# Patient Record
Sex: Female | Born: 1970 | Race: White | Hispanic: No | Marital: Single | Smoking: Never smoker
Health system: Southern US, Community
[De-identification: ages and names within clinical notes are randomized; demographics above are authoritative.]

## PROBLEM LIST (undated history)

## (undated) DIAGNOSIS — F329 Major depressive disorder, single episode, unspecified: Secondary | ICD-10-CM

## (undated) DIAGNOSIS — F32A Depression, unspecified: Secondary | ICD-10-CM

## (undated) DIAGNOSIS — R51 Headache: Secondary | ICD-10-CM

## (undated) DIAGNOSIS — R519 Headache, unspecified: Secondary | ICD-10-CM

## (undated) HISTORY — DX: Depression, unspecified: F32.A

## (undated) HISTORY — DX: Headache, unspecified: R51.9

## (undated) HISTORY — DX: Headache: R51

## (undated) HISTORY — DX: Major depressive disorder, single episode, unspecified: F32.9

## (undated) HISTORY — PX: ABDOMINAL HYSTERECTOMY: SHX81

---

## 2005-12-31 ENCOUNTER — Ambulatory Visit: Payer: Self-pay | Admitting: Unknown Physician Specialty

## 2009-11-01 ENCOUNTER — Emergency Department: Payer: Self-pay | Admitting: Emergency Medicine

## 2012-06-22 HISTORY — PX: AUGMENTATION MAMMAPLASTY: SUR837

## 2012-12-07 ENCOUNTER — Other Ambulatory Visit: Payer: Self-pay | Admitting: Family Medicine

## 2012-12-07 LAB — CBC WITH DIFFERENTIAL/PLATELET
Eosinophil #: 0.1 10*3/uL (ref 0.0–0.7)
Eosinophil %: 1.7 %
HCT: 40.4 % (ref 35.0–47.0)
Lymphocyte %: 31.2 %
MCH: 31.3 pg (ref 26.0–34.0)
MCHC: 34 g/dL (ref 32.0–36.0)
MCV: 92 fL (ref 80–100)
Monocyte %: 6.3 %
Neutrophil %: 60.3 %
Platelet: 224 10*3/uL (ref 150–440)
RBC: 4.4 10*6/uL (ref 3.80–5.20)
RDW: 13.1 % (ref 11.5–14.5)
WBC: 6.2 10*3/uL (ref 3.6–11.0)

## 2012-12-07 LAB — COMPREHENSIVE METABOLIC PANEL
Albumin: 4.2 g/dL (ref 3.4–5.0)
Alkaline Phosphatase: 75 U/L (ref 50–136)
Bilirubin,Total: 0.3 mg/dL (ref 0.2–1.0)
Calcium, Total: 8.7 mg/dL (ref 8.5–10.1)
Chloride: 104 mmol/L (ref 98–107)
Co2: 29 mmol/L (ref 21–32)
Creatinine: 0.72 mg/dL (ref 0.60–1.30)
Glucose: 81 mg/dL (ref 65–99)
Osmolality: 277 (ref 275–301)
Potassium: 3.8 mmol/L (ref 3.5–5.1)
Sodium: 138 mmol/L (ref 136–145)
Total Protein: 7.6 g/dL (ref 6.4–8.2)

## 2012-12-07 LAB — LIPID PANEL
Cholesterol: 191 mg/dL (ref 0–200)
Ldl Cholesterol, Calc: 93 mg/dL (ref 0–100)
Triglycerides: 68 mg/dL (ref 0–200)
VLDL Cholesterol, Calc: 14 mg/dL (ref 5–40)

## 2012-12-12 ENCOUNTER — Ambulatory Visit: Payer: Self-pay | Admitting: Family Medicine

## 2016-07-02 ENCOUNTER — Ambulatory Visit (INDEPENDENT_AMBULATORY_CARE_PROVIDER_SITE_OTHER): Payer: BLUE CROSS/BLUE SHIELD | Admitting: Family Medicine

## 2016-07-02 ENCOUNTER — Ambulatory Visit (INDEPENDENT_AMBULATORY_CARE_PROVIDER_SITE_OTHER): Payer: BLUE CROSS/BLUE SHIELD

## 2016-07-02 ENCOUNTER — Encounter: Payer: Self-pay | Admitting: Family Medicine

## 2016-07-02 VITALS — BP 119/80 | HR 68 | Temp 99.1°F | Resp 14 | Wt 133.8 lb

## 2016-07-02 DIAGNOSIS — F329 Major depressive disorder, single episode, unspecified: Secondary | ICD-10-CM | POA: Insufficient documentation

## 2016-07-02 DIAGNOSIS — F419 Anxiety disorder, unspecified: Secondary | ICD-10-CM

## 2016-07-02 DIAGNOSIS — R69 Illness, unspecified: Secondary | ICD-10-CM

## 2016-07-02 DIAGNOSIS — G4700 Insomnia, unspecified: Secondary | ICD-10-CM | POA: Insufficient documentation

## 2016-07-02 DIAGNOSIS — J111 Influenza due to unidentified influenza virus with other respiratory manifestations: Secondary | ICD-10-CM

## 2016-07-02 LAB — POCT INFLUENZA A/B
INFLUENZA B, POC: NEGATIVE
Influenza A, POC: NEGATIVE

## 2016-07-02 MED ORDER — HYDROCOD POLST-CPM POLST ER 10-8 MG/5ML PO SUER: 5.0000 mL | Freq: Two times a day (BID) | ORAL | 0 refills | Status: DC | PRN

## 2016-07-02 NOTE — Patient Instructions (Signed)
Cough medicine as needed.  Rest. Fluids.  We will call with xray results.  Take care  Dr. Adriana Simasook  Follow up annually or sooner if needed

## 2016-07-02 NOTE — Progress Notes (Signed)
Pre visit review using our clinic review tool, if applicable. No additional management support is needed unless otherwise documented below in the visit note. 

## 2016-07-02 NOTE — Progress Notes (Signed)
Subjective:  Patient ID: Allison Hudson, female    DOB: 1971/01/17  Age: 46 y.o. MRN: [id number]  CC: Acute illness - concern for flu  HPI Allison Hudson is a 46 y.o. female presents to the clinic today with concerns for influenza.  Patient states she's been sick since Monday. She's had cough, fever, headache, sore throat, body aches, chills. Most recent fever was last night and was 103. She's been using Advil cold as well as ibuprofen. She's also been using Mucinex. Little improvement in symptoms. Symptoms are severe. No known exacerbating factors. She has had numerous sick contacts at work. No other associated symptoms. No other complaints or concerns at this time.  PMH, Surgical Hx, Family Hx, Social History reviewed and updated as below.  Past Medical History:  Diagnosis Date  . Depression   . Frequent headaches    Past Surgical History:  Procedure Laterality Date  . ABDOMINAL HYSTERECTOMY     Family hx - None per report.  Social History  Substance Use Topics  . Smoking status: Never Smoker  . Smokeless tobacco: Never Used  . Alcohol use 1.8 oz/week    3 Standard drinks or equivalent per week   Review of Systems  Constitutional: Positive for chills and fever.  HENT: Positive for sore throat.   Respiratory: Positive for cough.   Gastrointestinal:       Abdominal cramping.  Neurological: Positive for headaches.  Psychiatric/Behavioral: Positive for sleep disturbance.  All other systems reviewed and are negative.  Objective:   Today's Vitals: BP 119/80   Pulse 68   Temp 99.1 F (37.3 C) (Oral)   Resp 14   Wt 133 lb 12.8 oz (60.7 kg)   SpO2 96%   Physical Exam  Constitutional: She is oriented to person, place, and time. She appears well-developed and well-nourished. No distress.  HENT:  Head: Normocephalic and atraumatic.  Nose: Nose normal.  Mouth/Throat: Oropharynx is clear and moist. No oropharyngeal exudate.  Normal TMs bilaterally.  Eyes: Conjunctivae  are normal. No scleral icterus.  Neck: Neck supple.  Cardiovascular: Normal rate and regular rhythm.   No murmur heard. Pulmonary/Chest: Effort normal and breath sounds normal. She has no wheezes. She has no rales.  Abdominal: Soft. She exhibits no distension.  Tender palpation in the epigastric region as well as the left lower quadrant.  Musculoskeletal: Normal range of motion. She exhibits no edema.  Lymphadenopathy:    She has no cervical adenopathy.  Neurological: She is alert and oriented to person, place, and time.  Skin: Skin is warm and dry. No rash noted.  Psychiatric: She has a normal mood and affect.  Vitals reviewed.  Assessment & Plan:   Problem List Items Addressed This Visit    Influenza-like illness - Primary    New problem. Flu negative. Out of the treatment window even if actually has influenza. Treating cough with Tussionex. Obtaining chest x-ray today given high fever and cough, to ensure no underlying pneumonia.      Relevant Medications   chlorpheniramine-HYDROcodone (TUSSIONEX PENNKINETIC ER) 10-8 MG/5ML SUER   Other Relevant Orders   POCT Influenza A/B (Completed)   DG Chest 2 View      Outpatient Encounter Prescriptions as of 07/02/2016  Medication Sig  . Cholecalciferol (VITAMIN D3) 1000 units CAPS Take by mouth.  . citalopram (CELEXA) 20 MG tablet Take by mouth.  . Multiple Vitamin (MULTI-VITAMINS) TABS Take by mouth.  . chlorpheniramine-HYDROcodone (TUSSIONEX PENNKINETIC ER) 10-8 MG/5ML SUER Take 5  mLs by mouth every 12 (twelve) hours as needed.   No facility-administered encounter medications on file as of 07/02/2016.     Follow-up: PRN  Everlene Other DO [facility]

## 2016-07-02 NOTE — Assessment & Plan Note (Signed)
New problem. Flu negative. Out of the treatment window even if actually has influenza. Treating cough with Tussionex. Obtaining chest x-ray today given high fever and cough, to ensure no underlying pneumonia.

## 2017-01-06 ENCOUNTER — Other Ambulatory Visit: Payer: Self-pay | Admitting: *Deleted

## 2017-01-06 NOTE — Telephone Encounter (Signed)
Medication Refill requested for :Celexa  Pharmacy:[facility] [address]  Return Contact : [phone]

## 2017-01-06 NOTE — Telephone Encounter (Signed)
Last ov 07/02/16 for influenza like illness (first appmt with [facility], pt has not been seen since) Filed under historical Patient requested and received refill form Dr.Bronstein PCP 12/08/16 and was not given refills. Was scheduled for follow up and notified to keep appmt for further refills with [facility].Patient did not keep appmt which was made 12/21/16.

## 2017-01-07 NOTE — Telephone Encounter (Signed)
Left message to return call to inform patient she needs to establish care here or see her pcp Dr.Bronstein for refills

## 2017-01-12 ENCOUNTER — Other Ambulatory Visit: Payer: Self-pay | Admitting: Family Medicine

## 2017-01-12 MED ORDER — CITALOPRAM HYDROBROMIDE 20 MG PO TABS: 20.0000 mg | ORAL_TABLET | Freq: Every day | ORAL | 1 refills | Status: DC

## 2017-02-15 ENCOUNTER — Ambulatory Visit: Payer: BLUE CROSS/BLUE SHIELD | Admitting: Family Medicine

## 2017-03-01 ENCOUNTER — Encounter: Payer: Self-pay | Admitting: Family Medicine

## 2017-03-01 ENCOUNTER — Ambulatory Visit (INDEPENDENT_AMBULATORY_CARE_PROVIDER_SITE_OTHER): Payer: BLUE CROSS/BLUE SHIELD | Admitting: Family Medicine

## 2017-03-01 VITALS — BP 122/80 | HR 84 | Temp 97.6°F | Ht 62.0 in | Wt 136.8 lb

## 2017-03-01 DIAGNOSIS — Z79890 Hormone replacement therapy (postmenopausal): Secondary | ICD-10-CM | POA: Diagnosis not present

## 2017-03-01 DIAGNOSIS — F329 Major depressive disorder, single episode, unspecified: Secondary | ICD-10-CM

## 2017-03-01 DIAGNOSIS — F419 Anxiety disorder, unspecified: Secondary | ICD-10-CM | POA: Diagnosis not present

## 2017-03-01 MED ORDER — CITALOPRAM HYDROBROMIDE 20 MG PO TABS
20.0000 mg | ORAL_TABLET | Freq: Every day | ORAL | 1 refills | Status: DC
Start: 2017-03-01 — End: 2017-08-30

## 2017-03-01 NOTE — Progress Notes (Signed)
  Tommi Rumps, MD Phone: [phone]  Allison Hudson is a 46 y.o. female who presents today for follow-up.  Anxiety and depression: Patient notes it's anxiety now. No depression. She is on Celexa and this is quite helpful. When she doesn't take it she'll get more emotional. She has some mild anxiety based on her job. No SI or HI.  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 She cycles on and off. Started this about 9 months ago. She wants to know if any lab work needs to be checked. She does note deepening of her voice and increased hair growth while being on this.   ROS see history of present illness  Objective  Physical Exam Vitals:   03/01/17 1514  BP: 122/80  Pulse: 84  Temp: 97.6 F (36.4 C)  SpO2: 98%    BP Readings from Last 3 Encounters:  03/01/17 122/80  07/02/16 119/80   Wt Readings from Last 3 Encounters:  03/01/17 136 lb 12.8 oz (62.1 kg)  07/02/16 133 lb 12.8 oz (60.7 kg)    Physical Exam  Constitutional: No distress.  Cardiovascular: Normal rate, regular rhythm and normal heart sounds.   Pulmonary/Chest: Effort normal and breath sounds normal.  Musculoskeletal: She exhibits no edema.  Neurological: She is alert. Gait normal.  Skin: Skin is warm and dry. She is not diaphoretic.     Assessment/Plan: Please see individual problem list.  Anxiety and depression Well-controlled on Celexa. Refill given.  Long-term current use of testosterone replacement therapy Patient has been using testosterone supplementation for body building. I advised that she should not do this though she notes she is not going to stop. I discussed the cardiac risk of taking this medication. We will check lab work as outlined below.   Orders Placed This Encounter  Procedures  . Testosterone    Standing Status:   Future    Standing Expiration Date:   03/01/2018  . Lipid panel    Standing Status:   Future    Standing Expiration Date:    03/01/2018  . Comp Met (CMET)    Standing Status:   Future    Standing Expiration Date:   03/01/2018  . CBC    Standing Status:   Future    Standing Expiration Date:   03/01/2018    Meds ordered this encounter  Medications  . DISCONTD: citalopram (CELEXA) 20 MG tablet    Sig: Take 20 mg by mouth daily.  Marland Kitchen DISCONTD: Cholecalciferol (VITAMIN D3) 1000 units CAPS    Sig: Take 1,000 Units by mouth.  . DISCONTD: Multiple Vitamin (MULTI-VITAMINS) TABS    Sig: Take by mouth.  . Ascorbic Acid (VITAMIN C) 100 MG tablet    Sig: Take 100 mg by mouth daily.  . citalopram (CELEXA) 20 MG tablet    Sig: Take 1 tablet (20 mg total) by mouth daily.    Dispense:  90 tablet    Refill:  1   Tommi Rumps, MD [place]

## 2017-03-01 NOTE — Patient Instructions (Signed)
Nice to meet you. We will have you return for fasting lab work. This should be done between 8 and 10 AM. You can have black coffee and water after midnight when you do this.

## 2017-03-02 ENCOUNTER — Encounter: Payer: Self-pay | Admitting: Family Medicine

## 2017-03-02 ENCOUNTER — Telehealth: Payer: Self-pay | Admitting: Family Medicine

## 2017-03-02 DIAGNOSIS — Z79890 Hormone replacement therapy (postmenopausal): Secondary | ICD-10-CM

## 2017-03-02 DIAGNOSIS — Z5181 Encounter for therapeutic drug level monitoring: Secondary | ICD-10-CM | POA: Insufficient documentation

## 2017-03-02 NOTE — Assessment & Plan Note (Signed)
Patient has been using testosterone supplementation for body building. I advised that she should not do this though she notes she is not going to stop. I discussed the cardiac risk of taking this medication. We will check lab work as outlined below.

## 2017-03-02 NOTE — Assessment & Plan Note (Addendum)
Well-controlled on Celexa. Refill given.

## 2017-03-02 NOTE — Telephone Encounter (Signed)
Please advise 

## 2017-03-02 NOTE — Telephone Encounter (Signed)
Pt called and wished to speak with Dr. Birdie SonsSonnenberg about something personal. Please advise, thank you!  Call pt @ [phone]

## 2017-03-02 NOTE — Telephone Encounter (Signed)
Spoke with patient. She wanted to inform us that she was also taking growth hormone. She notes she will bring in a copy of her supplements with doses when she gets them completed.

## 2017-03-05 ENCOUNTER — Other Ambulatory Visit: Payer: BLUE CROSS/BLUE SHIELD

## 2017-08-30 ENCOUNTER — Other Ambulatory Visit (HOSPITAL_COMMUNITY)
Admission: RE | Admit: 2017-08-30 | Discharge: 2017-08-30 | Disposition: A | Discharge status: Home / Self Care | Payer: BLUE CROSS/BLUE SHIELD | Source: Ambulatory Visit | Attending: Family Medicine | Admitting: Family Medicine

## 2017-08-30 ENCOUNTER — Other Ambulatory Visit: Payer: Self-pay

## 2017-08-30 ENCOUNTER — Ambulatory Visit (INDEPENDENT_AMBULATORY_CARE_PROVIDER_SITE_OTHER): Payer: BLUE CROSS/BLUE SHIELD | Admitting: Family Medicine

## 2017-08-30 ENCOUNTER — Encounter: Payer: Self-pay | Admitting: Family Medicine

## 2017-08-30 VITALS — BP 100/70 | HR 63 | Temp 98.2°F | Ht 62.0 in | Wt 131.2 lb

## 2017-08-30 DIAGNOSIS — F329 Major depressive disorder, single episode, unspecified: Secondary | ICD-10-CM

## 2017-08-30 DIAGNOSIS — F419 Anxiety disorder, unspecified: Secondary | ICD-10-CM

## 2017-08-30 DIAGNOSIS — R102 Pelvic and perineal pain: Secondary | ICD-10-CM | POA: Diagnosis not present

## 2017-08-30 DIAGNOSIS — Z124 Encounter for screening for malignant neoplasm of cervix: Secondary | ICD-10-CM | POA: Diagnosis not present

## 2017-08-30 DIAGNOSIS — Z79890 Hormone replacement therapy (postmenopausal): Secondary | ICD-10-CM

## 2017-08-30 MED ORDER — CITALOPRAM HYDROBROMIDE 40 MG PO TABS: 40.0000 mg | ORAL_TABLET | Freq: Every day | ORAL | 1 refills | Status: DC

## 2017-08-30 NOTE — Patient Instructions (Signed)
Nice to see you. We will go up on your Celexa. We will get you to see gynecology. We will check some lab work today. If you have worsening discomfort please be evaluated immediately.

## 2017-08-30 NOTE — Assessment & Plan Note (Signed)
Worsened recently.  Discussed therapy and medication though she declined therapy.  We will increase her Celexa.  She will switch it to the morning to see if that helps with not staying awake at night.  She is given return precautions.

## 2017-08-30 NOTE — Progress Notes (Signed)
Marikay Alar, MD Phone: [phone]  Allison Hudson is a 47 y.o. female who presents today for follow-up.  Anxiety/depression: Notes this has gotten worse recently.  She broke up with her long-term partner and has had trouble sleeping and not wanting to do anything since then.  She notes no SI.  She has been on Celexa.  Patient reports chronic right-sided pelvic discomfort that has been going on since a tummy tuck she had 2 years ago.  Notes it is intermittent.  Does hurt with palpation at times.  Has not worsened.  She reports a history of hysterectomy though she thinks that may have left her cervix.  Does still have her appendix.  No vaginal discharge, dysuria, or diarrhea.  Occasional abdominal cramping.  She is no longer on testosterone supplementation for body building though does continue to take growth hormone.  Social History   Tobacco Use  Smoking Status Never Smoker  Smokeless Tobacco Never Used     ROS see history of present illness  Objective  Physical Exam Vitals:   08/30/17 1540  BP: 100/70  Pulse: 63  Temp: 98.2 F (36.8 C)  SpO2: 95%    BP Readings from Last 3 Encounters:  08/30/17 100/70  03/01/17 122/80  07/02/16 119/80   Wt Readings from Last 3 Encounters:  08/30/17 131 lb 3.2 oz (59.5 kg)  03/01/17 136 lb 12.8 oz (62.1 kg)  07/02/16 133 lb 12.8 oz (60.7 kg)    Physical Exam  Constitutional: No distress.  Cardiovascular: Normal rate, regular rhythm and normal heart sounds.  Pulmonary/Chest: Effort normal and breath sounds normal.  Abdominal: Soft. Bowel sounds are normal. She exhibits no distension.    Genitourinary:  Genitourinary Comments: Normal external labia, normal vaginal mucosa, no discharge noted, cervix noted with possible cystic lesions at the 9:00 and 12:00 locations, no cervical motion tenderness, no adnexal masses palpated  Musculoskeletal: She exhibits no edema.  Neurological: She is alert. Gait normal.  Skin: Skin is  warm and dry. She is not diaphoretic.     Assessment/Plan: Please see individual problem list.  Anxiety and depression Worsened recently.  Discussed therapy and medication though she declined therapy.  We will increase her Celexa.  She will switch it to the morning to see if that helps with not staying awake at night.  She is given return precautions.  Pelvic pain Patient with right pelvic/suprapubic discomfort versus right lower quadrant discomfort that has been chronic.  Could be musculoskeletal or ovarian pathology.  History does not fit with an appendiceal issue.  Pelvic exam completed with Pap smear done.  Given possible cysts on cervix as well as question of pelvic pain we will refer to gynecology.  I offered imaging with CT scan or ultrasound though she deferred until her Pap smear returned and she had been evaluated by gynecology.  We will check a CBC and CMP.  Given return precautions.  Long-term current use of testosterone replacement therapy No longer on testosterone.  Now taking growth hormone.     Orders Placed This Encounter  Procedures  . Ambulatory referral to Gynecology    Referral Priority:   Routine    Referral Type:   Consultation    Referral Reason:   Specialty Services Required    Requested Specialty:   Gynecology    Number of Visits Requested:   1    Meds ordered this encounter  Medications  . citalopram (CELEXA) 40 MG tablet    Sig: Take 1 tablet (40  mg total) by mouth daily.    Dispense:  90 tablet    Refill:  1     Marikay AlarEric Sonnenberg, MD [facility]

## 2017-08-30 NOTE — Assessment & Plan Note (Signed)
Patient with right pelvic/suprapubic discomfort versus right lower quadrant discomfort that has been chronic.  Could be musculoskeletal or ovarian pathology.  History does not fit with an appendiceal issue.  Pelvic exam completed with Pap smear done.  Given possible cysts on cervix as well as question of pelvic pain we will refer to gynecology.  I offered imaging with CT scan or ultrasound though she deferred until her Pap smear returned and she had been evaluated by gynecology.  We will check a CBC and CMP.  Given return precautions.

## 2017-08-30 NOTE — Assessment & Plan Note (Signed)
No longer on testosterone.  Now taking growth hormone.

## 2017-08-31 LAB — CBC
HCT: 42 % (ref 36.0–46.0)
HEMOGLOBIN: 14.2 g/dL (ref 12.0–15.0)
MCHC: 33.9 g/dL (ref 30.0–36.0)
MCV: 95.2 fl (ref 78.0–100.0)
PLATELETS: 198 10*3/uL (ref 150.0–400.0)
RBC: 4.41 Mil/uL (ref 3.87–5.11)
RDW: 13.3 % (ref 11.5–15.5)
WBC: 6.9 10*3/uL (ref 4.0–10.5)

## 2017-08-31 LAB — COMPREHENSIVE METABOLIC PANEL
ALBUMIN: 4.3 g/dL (ref 3.5–5.2)
ALK PHOS: 58 U/L (ref 39–117)
ALT: 24 U/L (ref 0–35)
AST: 24 U/L (ref 0–37)
BILIRUBIN TOTAL: 0.4 mg/dL (ref 0.2–1.2)
BUN: 28 mg/dL — ABNORMAL HIGH (ref 6–23)
CALCIUM: 9.3 mg/dL (ref 8.4–10.5)
CO2: 30 meq/L (ref 19–32)
CREATININE: 0.97 mg/dL (ref 0.40–1.20)
Chloride: 103 mEq/L (ref 96–112)
GFR: 65.74 mL/min (ref >60.00)
Glucose, Bld: 60 mg/dL — ABNORMAL LOW (ref 70–99)
Potassium: 4.3 mEq/L (ref 3.5–5.1)
Sodium: 139 mEq/L (ref 135–145)
TOTAL PROTEIN: 6.9 g/dL (ref 6.0–8.3)

## 2017-09-02 ENCOUNTER — Other Ambulatory Visit: Payer: Self-pay | Admitting: Family Medicine

## 2017-09-02 DIAGNOSIS — E162 Hypoglycemia, unspecified: Secondary | ICD-10-CM

## 2017-09-02 LAB — CYTOLOGY - PAP
ADEQUACY: ABSENT
Diagnosis: NEGATIVE
HPV: NOT DETECTED

## 2017-09-03 ENCOUNTER — Other Ambulatory Visit (INDEPENDENT_AMBULATORY_CARE_PROVIDER_SITE_OTHER): Payer: BLUE CROSS/BLUE SHIELD

## 2017-09-03 DIAGNOSIS — E162 Hypoglycemia, unspecified: Secondary | ICD-10-CM | POA: Diagnosis not present

## 2017-09-03 NOTE — Addendum Note (Signed)
Addended by: Penne LashWIGGINS, [place] Anagnos N on: 09/03/2017 10:28 AM   Modules accepted: Orders

## 2017-09-04 LAB — BASIC METABOLIC PANEL
BUN/Creatinine Ratio: 26 (calc) — ABNORMAL HIGH (ref 6–22)
BUN: 31 mg/dL — ABNORMAL HIGH (ref 7–25)
CHLORIDE: 112 mmol/L — AB (ref 98–110)
CO2: 12 mmol/L — ABNORMAL LOW (ref 20–32)
Calcium: 9.5 mg/dL (ref 8.6–10.2)
Creat: 1.17 mg/dL — ABNORMAL HIGH (ref 0.50–1.10)
Glucose, Bld: 70 mg/dL (ref 65–99)
POTASSIUM: 6.9 mmol/L — AB (ref 3.5–5.3)
Sodium: 144 mmol/L (ref 135–146)

## 2017-09-05 ENCOUNTER — Other Ambulatory Visit
Admission: EM | Admit: 2017-09-05 | Discharge: 2017-09-05 | Disposition: A | Discharge status: Home / Self Care | Payer: BLUE CROSS/BLUE SHIELD | Source: Other Acute Inpatient Hospital | Attending: Family Medicine | Admitting: Family Medicine

## 2017-09-05 ENCOUNTER — Telehealth: Payer: Self-pay | Admitting: Family Medicine

## 2017-09-05 ENCOUNTER — Ambulatory Visit
Admission: EM | Admit: 2017-09-05 | Discharge: 2017-09-05 | Disposition: A | Discharge status: Home / Self Care | Payer: BLUE CROSS/BLUE SHIELD | Attending: Family Medicine | Admitting: Family Medicine

## 2017-09-05 ENCOUNTER — Other Ambulatory Visit: Payer: Self-pay

## 2017-09-05 ENCOUNTER — Encounter: Payer: Self-pay | Admitting: Emergency Medicine

## 2017-09-05 DIAGNOSIS — R634 Abnormal weight loss: Secondary | ICD-10-CM

## 2017-09-05 DIAGNOSIS — R899 Unspecified abnormal finding in specimens from other organs, systems and tissues: Secondary | ICD-10-CM | POA: Diagnosis not present

## 2017-09-05 DIAGNOSIS — R1030 Lower abdominal pain, unspecified: Secondary | ICD-10-CM

## 2017-09-05 DIAGNOSIS — G8929 Other chronic pain: Secondary | ICD-10-CM

## 2017-09-05 LAB — BASIC METABOLIC PANEL
ANION GAP: 6 (ref 5–15)
BUN: 24 mg/dL — ABNORMAL HIGH (ref 6–20)
CALCIUM: 8.7 mg/dL — AB (ref 8.9–10.3)
CO2: 23 mmol/L (ref 22–32)
Chloride: 105 mmol/L (ref 101–111)
Creatinine, Ser: 0.67 mg/dL (ref 0.44–1.00)
GLUCOSE: 171 mg/dL — AB (ref 65–99)
Potassium: 3.6 mmol/L (ref 3.5–5.1)
SODIUM: 134 mmol/L — AB (ref 135–145)

## 2017-09-05 NOTE — ED Provider Notes (Signed)
[facility]    CSN: [id number] Arrival date & time: 09/05/17  1321  History   Chief Complaint Chief Complaint  Patient presents with  . Follow-up   HPI  47 year old female presents for laboratory studies given recent abnormal laboratory studies.  Patient was recently seen by her primary care physician.  Laboratory studies were obtained given ongoing abdominal pain.  She has ongoing chronic lower abdominal pain.  Laboratory studies revealed hypoglycemia.  She had repeat studies which returned abnormal.  She had a potassium of 6.9, CO2 of 12, elevated BUN and creatinine.  She spoke with her primary care physician today who recommended that she come to urgent care for repeat studies.  She states that she is feeling well but has ongoing chronic lower abdominal pain.  In addition, she has had decreased energy/drive.  She has had a recent breakup.  Patient is feeling okay.  Desires laboratory studies today.  Past Medical History:  Diagnosis Date  . Depression   . Frequent headaches    Patient Active Problem List   Diagnosis Date Noted  . Pelvic pain 08/30/2017  . Long-term current use of testosterone replacement therapy 03/02/2017  . Anxiety and depression 07/02/2016  . Insomnia 07/02/2016   Past Surgical History:  Procedure Laterality Date  . ABDOMINAL HYSTERECTOMY      OB History    No data available     Home Medications    Prior to Admission medications   Medication Sig Start Date End Date Taking? Authorizing Provider  Amino Acids (L-CARNITINE PO) Take 1 g by mouth.   Yes [provider]  Ascorbic Acid (VITAMIN C) 100 MG tablet Take 100 mg by mouth daily.   Yes [provider]  Cholecalciferol (VITAMIN D3) 1000 units CAPS Take by mouth.   Yes [provider]  citalopram (CELEXA) 40 MG tablet Take 1 tablet (40 mg total) by mouth daily. 08/30/17  Yes Glori LuisSonnenberg, Eric G, MD  glucosamine-chondroitin 500-400 MG tablet Take 1 tablet by mouth  3 (three) times daily.   Yes [provider]  magnesium 30 MG tablet Take 30 mg by mouth 2 (two) times daily.   Yes [provider]  Multiple Vitamin (MULTI-VITAMINS) TABS Take by mouth.   Yes [provider]  UNABLE TO FIND Liver Aid   Yes [provider]  UNABLE TO FIND GH   Yes [provider]  Coenzyme Q10 (CO Q 10 PO) Take by mouth.    [provider]    Family History History reviewed. No pertinent family history.  Social History Social History   Tobacco Use  . Smoking status: Never Smoker  . Smokeless tobacco: Never Used  Substance Use Topics  . Alcohol use: Yes    Alcohol/week: 1.8 oz    Types: 3 Standard drinks or equivalent per week  . Drug use: No     Allergies   Patient has no known allergies.   Review of Systems Review of Systems  Constitutional: Positive for fatigue.  Gastrointestinal: Positive for abdominal pain.   Physical Exam Triage Vital Signs ED Triage Vitals  Enc Vitals Group     BP 09/05/17 1345 111/68     Pulse Rate 09/05/17 1345 63     Resp 09/05/17 1345 14     Temp 09/05/17 1345 98 F (36.7 C)     Temp Source 09/05/17 1345 Oral     SpO2 09/05/17 1345 100 %     Weight 09/05/17 1342 124 lb (  56.2 kg)     Height 09/05/17 1342 5\' 2"  (1.575 m)     Head Circumference --      Peak Flow --      Pain Score 09/05/17 1341 3     Pain Loc --      Pain Edu? --      Excl. in GC? --    Updated Vital Signs BP 111/68 (BP Location: Left Arm)   Pulse 63   Temp 98 F (36.7 C) (Oral)   Resp 14   Ht 5\' 2"  (1.575 m)   Wt 124 lb (56.2 kg)   SpO2 100%   BMI 22.68 kg/m    Physical Exam  Constitutional: She is oriented to person, place, and time. She appears well-developed. No distress.  Cardiovascular: Normal rate and regular rhythm.  Pulmonary/Chest: Effort normal and breath sounds normal. She has no rales.  Abdominal: Soft. She exhibits no distension.  Mild tenderness palpation in the right  lower abdomen/pelvis.  Neurological: She is oriented to person, place, and time.  Psychiatric: She has a normal mood and affect. Her behavior is normal.  Nursing note and vitals reviewed.  UC Treatments / Results  Labs (all labs ordered are listed, but only abnormal results are displayed) Labs Reviewed  BASIC METABOLIC PANEL - Abnormal; Notable for the following components:      Result Value   Sodium 134 (*)    Glucose, Bld 171 (*)    BUN 24 (*)    Calcium 8.7 (*)    All other components within normal limits    EKG  EKG Interpretation None       Radiology No results found.  Procedures Procedures (including critical care time)  Medications Ordered in UC Medications - No data to display   Initial Impression / Assessment and Plan / UC Course  I have reviewed the triage vital signs and the nursing notes.  Pertinent labs & imaging results that were available during my care of the patient were reviewed by me and considered in my medical decision making (see chart for details).     47 year old female presents for repeat laboratory studies given recent abnormal metabolic panel.  Potassium has normalized.  Creatinine normal.  Her glucose is mildly elevated.  I advised her to follow-up closely with her primary care physician.  Patient in agreement.  Final Clinical Impressions(s) / UC Diagnoses   Final diagnoses:  Abnormal laboratory test    ED Discharge Orders    None     Controlled Substance Prescriptions [place] Controlled Substance Registry consulted? Not Applicable   Tommie Sams, DO 09/05/17 1505

## 2017-09-05 NOTE — Discharge Instructions (Signed)
Potassium and kidney function were normal.  Follow up with Sonnenberg.  Take care  Dr. Adriana Simasook

## 2017-09-05 NOTE — Telephone Encounter (Signed)
Spoke with patient regarding results. It appears that there may be some level of hemolysis that led to the abnormal labs and possible issues with exposure of the sample to the air. Discussed that her potassium was elevated though this may be falsely elevated. She reports she has been dealing with the anxiety and depression and not wanting to get out of bed as she was previously. She continues to have a dull ache in her lower abdomen. Reports she had decreased energy yesterday. Had some feeling of heart racing on Friday. No heart racing today. I advised that I suspect the potassium is abnormal related hemolysis and lab error though given her prior symptoms I think she needs these rechecked today. Given lack of symptoms today I think it is reasonable for evaluation at urgent care for recheck of this lab test. I advised [facility] to [facility] for evaluation of her prior symptoms and recheck of her lab work. She voiced understanding. I will have my CMA follow-up with the patient on Monday to ensure she got evaluated.  Additionally the patient notes she has continued to lose some weight despite eating 4-5 meals a day. She has continued GH though stopped other supplements. I advised that [facility]. We will proceed with CT abd/pelvis to evaluate her discomfort further.

## 2017-09-05 NOTE — ED Triage Notes (Signed)
Patient was told by her PCP to come here due to her blood tests that were done last week.  Patient states that her kidney function tests were abnormal

## 2017-09-06 NOTE — Telephone Encounter (Signed)
Noted.  Labs normalized.  Plan to proceed with CT scan as previously ordered.

## 2017-09-06 NOTE — Telephone Encounter (Signed)
Patient was seen 09/05/17 at [facility] urgent care by Dr.Cook

## 2017-09-07 ENCOUNTER — Telehealth: Payer: Self-pay

## 2017-09-07 NOTE — Telephone Encounter (Signed)
Copied from [place] [id number]. Topic: Referral - Status >> Sep 07, 2017  9:53 AM Micheline Maze B wrote: Reason for CRM: ok for PEC to give appt information: CT abdomen/pelvis scheduled on March 28 at 8:30 am. She will need to arrive 15 minutes early for check in and nothing to eat or drink after midnight. She will need to pick up a prep kit (contrast drink) at least one day prior to her appointment. This will be done at [facility]. Address is [place]. It is off [address] and beside [address]. If she needs to reschedule please call [phone]. She can pick up her prep kit at this same location or [facility]

## 2017-09-16 ENCOUNTER — Ambulatory Visit
Admission: RE | Admit: 2017-09-16 | Discharge: 2017-09-16 | Disposition: A | Discharge status: Home / Self Care | Payer: BLUE CROSS/BLUE SHIELD | Source: Ambulatory Visit | Attending: Family Medicine | Admitting: Family Medicine

## 2017-09-16 DIAGNOSIS — R634 Abnormal weight loss: Secondary | ICD-10-CM | POA: Insufficient documentation

## 2017-09-16 DIAGNOSIS — R102 Pelvic and perineal pain: Secondary | ICD-10-CM | POA: Insufficient documentation

## 2017-09-16 DIAGNOSIS — N200 Calculus of kidney: Secondary | ICD-10-CM | POA: Diagnosis not present

## 2017-09-16 MED ORDER — IOPAMIDOL (ISOVUE-300) INJECTION 61%
75.0000 mL | Freq: Once | INTRAVENOUS | Status: AC | PRN
  Administered 2017-09-16: 75 mL via INTRAVENOUS

## 2017-11-01 ENCOUNTER — Ambulatory Visit: Payer: BLUE CROSS/BLUE SHIELD | Admitting: Family Medicine

## 2018-02-22 ENCOUNTER — Other Ambulatory Visit: Payer: Self-pay | Admitting: Family Medicine

## 2018-02-24 NOTE — Telephone Encounter (Signed)
Last OV 08/30/17 ok to refill?

## 2018-06-06 ENCOUNTER — Other Ambulatory Visit: Payer: Self-pay

## 2018-06-06 MED ORDER — CITALOPRAM HYDROBROMIDE 40 MG PO TABS: 40.0000 mg | ORAL_TABLET | Freq: Every day | ORAL | 0 refills | Status: DC

## 2018-06-09 ENCOUNTER — Ambulatory Visit: Payer: Self-pay

## 2018-06-09 NOTE — Telephone Encounter (Signed)
[facility] set [facility] up or will another referral need to be placed?   Thanks

## 2018-06-09 NOTE — Telephone Encounter (Signed)
Pt. Reports she has had abdominal pain since March, and saw Dr. Birdie SonsSonnenberg. States he referred her to GYN and she never followed through with appointment. Has lower abdominal pain and bloating. No constipation or diarrhea. Pain has come back, and now she would like to follow through with GYN. Request Dr. Birdie SonsSonnenberg refer her again. She has no preference in regard to who it is with. Please advise pt. Contact number [phone].  Reason for Disposition . Abdominal pain is a chronic symptom (recurrent or ongoing AND present > 4 weeks)  Answer Assessment - Initial Assessment Questions 1. LOCATION: "Where does it hurt?"      Low abd. At ovaries 2. RADIATION: "Does the pain shoot anywhere else?" (e.g., chest, back)     Low back pain 3. ONSET: "When did the pain begin?" (e.g., minutes, hours or days ago)      On going and has become worse 4. SUDDEN: "Gradual or sudden onset?"     This week 5. PATTERN "Does the pain come and go, or is it constant?"    - If constant: "Is it getting better, staying the same, or worsening?"      (Note: Constant means the pain never goes away completely; most serious pain is constant and it progresses)     - If intermittent: "How long does it last?" "Do you have pain now?"     (Note: Intermittent means the pain goes away completely between bouts)     Constant 6. SEVERITY: "How bad is the pain?"  (e.g., Scale 1-10; mild, moderate, or severe)   - MILD (1-3): doesn't interfere with normal activities, abdomen soft and not tender to touch    - MODERATE (4-7): interferes with normal activities or awakens from sleep, tender to touch    - SEVERE (8-10): excruciating pain, doubled over, unable to do any normal activities      6 7. RECURRENT SYMPTOM: "Have you ever had this type of abdominal pain before?" If so, ask: "When was the last time?" and "What happened that time?"      Yes 8. CAUSE: "What do you think is causing the abdominal pain?"     Unsure 9.  RELIEVING/AGGRAVATING FACTORS: "What makes it better or worse?" (e.g., movement, antacids, bowel movement)     No 10. OTHER SYMPTOMS: "Has there been any vomiting, diarrhea, constipation, or urine problems?"       No 11. PREGNANCY: "Is there any chance you are pregnant?" "When was your last menstrual period?"       No  Protocols used: ABDOMINAL PAIN - [facility]

## 2018-06-10 NOTE — Telephone Encounter (Signed)
Referral was sent back to [facility]

## 2018-08-02 ENCOUNTER — Ambulatory Visit (INDEPENDENT_AMBULATORY_CARE_PROVIDER_SITE_OTHER): Payer: BLUE CROSS/BLUE SHIELD | Admitting: Family Medicine

## 2018-08-02 ENCOUNTER — Encounter: Payer: Self-pay | Admitting: Family Medicine

## 2018-08-02 VITALS — BP 106/60 | HR 59 | Temp 98.7°F | Ht 62.0 in | Wt 139.6 lb

## 2018-08-02 DIAGNOSIS — F419 Anxiety disorder, unspecified: Secondary | ICD-10-CM

## 2018-08-02 DIAGNOSIS — Z79890 Hormone replacement therapy (postmenopausal): Secondary | ICD-10-CM

## 2018-08-02 DIAGNOSIS — F329 Major depressive disorder, single episode, unspecified: Secondary | ICD-10-CM

## 2018-08-02 DIAGNOSIS — E663 Overweight: Secondary | ICD-10-CM | POA: Diagnosis not present

## 2018-08-02 DIAGNOSIS — R5383 Other fatigue: Secondary | ICD-10-CM

## 2018-08-02 DIAGNOSIS — Z5181 Encounter for therapeutic drug level monitoring: Secondary | ICD-10-CM | POA: Diagnosis not present

## 2018-08-02 DIAGNOSIS — R232 Flushing: Secondary | ICD-10-CM | POA: Diagnosis not present

## 2018-08-02 DIAGNOSIS — R102 Pelvic and perineal pain unspecified side: Secondary | ICD-10-CM

## 2018-08-02 DIAGNOSIS — F32A Depression, unspecified: Secondary | ICD-10-CM

## 2018-08-02 DIAGNOSIS — R030 Elevated blood-pressure reading, without diagnosis of hypertension: Secondary | ICD-10-CM

## 2018-08-02 LAB — LIPID PANEL
Cholesterol: 186 mg/dL (ref 0–200)
HDL: 73.5 mg/dL (ref >39.00)
LDL CALC: 102 mg/dL — AB (ref 0–99)
NONHDL: 112.73
Total CHOL/HDL Ratio: 3
Triglycerides: 56 mg/dL (ref 0.0–149.0)
VLDL: 11.2 mg/dL (ref 0.0–40.0)

## 2018-08-02 LAB — COMPREHENSIVE METABOLIC PANEL
ALT: 21 U/L (ref 0–35)
AST: 24 U/L (ref 0–37)
Albumin: 4.4 g/dL (ref 3.5–5.2)
Alkaline Phosphatase: 62 U/L (ref 39–117)
BUN: 21 mg/dL (ref 6–23)
CO2: 27 mEq/L (ref 19–32)
Calcium: 8.9 mg/dL (ref 8.4–10.5)
Chloride: 102 mEq/L (ref 96–112)
Creatinine, Ser: 0.72 mg/dL (ref 0.40–1.20)
GFR: 86.9 mL/min (ref >60.00)
Glucose, Bld: 78 mg/dL (ref 70–99)
Potassium: 4.6 mEq/L (ref 3.5–5.1)
Sodium: 139 mEq/L (ref 135–145)
Total Bilirubin: 0.4 mg/dL (ref 0.2–1.2)
Total Protein: 6.6 g/dL (ref 6.0–8.3)

## 2018-08-02 LAB — CBC
HCT: 41.4 % (ref 36.0–46.0)
HEMOGLOBIN: 13.9 g/dL (ref 12.0–15.0)
MCHC: 33.6 g/dL (ref 30.0–36.0)
MCV: 94.4 fl (ref 78.0–100.0)
Platelets: 223 10*3/uL (ref 150.0–400.0)
RBC: 4.38 Mil/uL (ref 3.87–5.11)
RDW: 13.2 % (ref 11.5–15.5)
WBC: 4.2 10*3/uL (ref 4.0–10.5)

## 2018-08-02 LAB — HEMOGLOBIN A1C: Hgb A1c MFr Bld: 5.3 % (ref 4.6–6.5)

## 2018-08-02 LAB — TESTOSTERONE: Testosterone: 11.49 ng/dL — ABNORMAL LOW (ref 15.00–40.00)

## 2018-08-02 LAB — VITAMIN D 25 HYDROXY (VIT D DEFICIENCY, FRACTURES): VITD: 57.68 ng/mL (ref 30.00–100.00)

## 2018-08-02 LAB — TSH: TSH: 1.05 u[IU]/mL (ref 0.35–4.50)

## 2018-08-02 LAB — VITAMIN B12: Vitamin B-12: 462 pg/mL (ref 211–911)

## 2018-08-02 LAB — FOLLICLE STIMULATING HORMONE: FSH: 7.2 m[IU]/mL

## 2018-08-02 NOTE — Assessment & Plan Note (Signed)
Relatively well controlled.  She does still have some sleeping issues.  We will check with her clinical pharmacist regarding adding trazodone.

## 2018-08-02 NOTE — Patient Instructions (Addendum)
Nice to see you. We will check with our clinical pharmacist regarding medication for your sleep. We will get you to see gynecology.  If you do not hear anything in the next week please contact us. We will check lab work today and contact you with the results.

## 2018-08-02 NOTE — Assessment & Plan Note (Signed)
Patient with some fatigue recently.  She does have some other vague symptoms.  We will check a TSH.  Check other lab work as outlined below.  FSH to evaluate for menopause given some of her symptoms.  Next step in management to be determined by results.

## 2018-08-02 NOTE — Progress Notes (Signed)
Tommi Rumps, MD Phone: [phone]  Allison Hudson is a 48 y.o. female who presents today for f/u.  CC: anxiety/depression, BP concern, pelvic pain, fatigue  Anxiety/depression: Patient notes some days are up and down though mostly she is doing well with this.  She notes no SI or HI.  She feels the Celexa has been helpful.  She does still have a hard time sleeping and is taking up to 30 mg of melatonin nightly.  Blood pressure concern: Patient notes her blood pressure was up during a health screen at work.  She does not check at home.  She notes no chest pain.  Pelvic pain: She continues to have issues with this.  It is same pain that she had previously.  Feels like her right lower quadrant and right pelvis will swell up.  She has had a hysterectomy though still has her ovaries.  She notes this has not changed.  She had a prior CT abdomen and pelvis with no obvious cause.  She was supposed to see GYN though deferred evaluation initially and then contacted [place] to try to get this set up in December though it does not appear that this was scheduled.  Fatigue: Patient reports that her drive just does not feel as good as it used to.  She continues to go to the gym though has not been able to do quite as much as she used to do.  She just does not feel as strong.  She does still work out for 45 minutes to an hour with weightlifting and cardio.  She does note her hair has gone from curly to straight and has been falling out.  No dry skin.  Occasionally she will feel hot during the day and at night though no excessive sweating.  Seems consistent with a hot flash when I described this.  She has not been on hormone supplementation in greater than 1 year.  Social History   Tobacco Use  Smoking Status Never Smoker  Smokeless Tobacco Never Used     ROS see history of present illness  Objective  Physical Exam Vitals:   08/02/18 0812  BP: 106/60  Pulse: (!) 59  Temp: 98.7 F (37.1 C)  SpO2:  98%    BP Readings from Last 3 Encounters:  08/02/18 106/60  09/05/17 111/68  08/30/17 100/70   Wt Readings from Last 3 Encounters:  08/02/18 139 lb 9.6 oz (63.3 kg)  09/05/17 124 lb (56.2 kg)  08/30/17 131 lb 3.2 oz (59.5 kg)    Physical Exam Constitutional:      General: She is not in acute distress.    Appearance: She is not diaphoretic.  Cardiovascular:     Rate and Rhythm: Normal rate and regular rhythm.     Heart sounds: Normal heart sounds.  Pulmonary:     Effort: Pulmonary effort is normal.     Breath sounds: Normal breath sounds.  Abdominal:     General: Bowel sounds are normal. There is no distension.     Palpations: Abdomen is soft.     Tenderness: There is no abdominal tenderness. There is no guarding or rebound.  Musculoskeletal:     Right lower leg: No edema.     Left lower leg: No edema.  Skin:    General: Skin is warm and dry.  Neurological:     Mental Status: She is alert.      Assessment/Plan: Please see individual problem list.  Anxiety and depression Relatively well controlled.  She does still have some sleeping issues.  We will check with her clinical pharmacist regarding adding trazodone.  Pelvic pain This is been a continued issue.  She was referred to GYN previously though did not end up seeing them.  I have referred her again today and spoken with our referral coordinator regarding sending the referral.  Encounter for monitoring testosterone replacement therapy Patient is no longer on hormone supplementation.  She is interested in checking her testosterone level.  I did advise her that this potentially may not be covered by her insurance.  Fatigue Patient with some fatigue recently.  She does have some other vague symptoms.  We will check a TSH.  Check other lab work as outlined below.  [place] to evaluate for menopause given some of her symptoms.  Next step in management to be determined by results.  Elevated BP without diagnosis of  hypertension Elevated during health screen at work.  Well-controlled today.  We will continue to monitor.   Orders Placed This Encounter  Procedures  . Comp Met (CMET)  . TSH  . CBC  . HgB A1c  . FSH  . Lipid panel  . Vitamin D (25 hydroxy)  . B12  . Testosterone  . Ambulatory referral to Gynecology    Referral Priority:   Routine    Referral Type:   Consultation    Referral Reason:   Specialty Services Required    Requested Specialty:   Gynecology    Number of Visits Requested:   1    No orders of the defined types were placed in this encounter.    Tommi Rumps, MD [place]

## 2018-08-02 NOTE — Assessment & Plan Note (Signed)
Elevated during health screen at work.  Well-controlled today.  We will continue to monitor.

## 2018-08-02 NOTE — Assessment & Plan Note (Signed)
Patient is no longer on hormone supplementation.  She is interested in checking her testosterone level.  I did advise her that this potentially may not be covered by her insurance.

## 2018-08-02 NOTE — Assessment & Plan Note (Signed)
This is been a continued issue.  She was referred to GYN previously though did not end up seeing them.  I have referred her again today and spoken with our referral coordinator regarding sending the referral.

## 2018-08-11 ENCOUNTER — Ambulatory Visit: Payer: Self-pay

## 2018-08-11 ENCOUNTER — Encounter: Payer: Self-pay | Admitting: Obstetrics and Gynecology

## 2018-08-11 ENCOUNTER — Ambulatory Visit (INDEPENDENT_AMBULATORY_CARE_PROVIDER_SITE_OTHER): Payer: BLUE CROSS/BLUE SHIELD | Admitting: Obstetrics and Gynecology

## 2018-08-11 VITALS — BP 111/68 | HR 61 | Ht 62.0 in | Wt 139.5 lb

## 2018-08-11 DIAGNOSIS — R7989 Other specified abnormal findings of blood chemistry: Secondary | ICD-10-CM | POA: Diagnosis not present

## 2018-08-11 DIAGNOSIS — R102 Pelvic and perineal pain: Secondary | ICD-10-CM | POA: Diagnosis not present

## 2018-08-11 NOTE — Telephone Encounter (Signed)
Patient called, left VM to return call to the office.  Summary: Referral F/U   Pt was leaving her referral appt today and would like a call back to discuss what was told to her today.

## 2018-08-11 NOTE — Patient Instructions (Signed)
Pelvic Pain, Female Pelvic pain is pain in your lower abdomen, below your belly button and between your hips. The pain may start suddenly (be acute), keep coming back (be recurring), or last a long time (become chronic). Pelvic pain that lasts longer than 6 months is considered chronic. Pelvic pain may affect your:  Reproductive organs.  Urinary system.  Digestive tract.  Musculoskeletal system. There are many potential causes of pelvic pain. Sometimes, the pain can be a result of digestive or urinary conditions, strained muscles or ligaments, or reproductive conditions. Sometimes the cause of pelvic pain is not known. Follow these instructions at home:   Take over-the-counter and prescription medicines only as told by your health care provider.  Rest as told by your health care provider.  Do not have sex if it hurts.  Keep a journal of your pelvic pain. Write down: ? When the pain started. ? Where the pain is located. ? What seems to make the pain better or worse, such as food or your period (menstrual cycle). ? Any symptoms you have along with the pain.  Keep all follow-up visits as told by your health care provider. This is important. Contact a health care provider if:  Medicine does not help your pain.  Your pain comes back.  You have new symptoms.  You have abnormal vaginal discharge or bleeding, including bleeding after menopause.  You have a fever or chills.  You are constipated.  You have blood in your urine or stool.  You have foul-smelling urine.  You feel weak or light-headed. Get help right away if:  You have sudden severe pain.  Your pain gets steadily worse.  You have severe pain along with fever, nausea, vomiting, or excessive sweating.  You lose consciousness. Summary  Pelvic pain is pain in your lower abdomen, below your belly button and between your hips.  There are many potential causes of pelvic pain.  Keep a journal of your pelvic  pain. This information is not intended to replace advice given to you by your health care provider. Make sure you discuss any questions you have with your health care provider. Document Released: 05/05/2004 Document Revised: 11/24/2017 Document Reviewed: 11/24/2017 Elsevier Interactive Patient Education  2019 Elsevier Inc.  

## 2018-08-11 NOTE — Telephone Encounter (Signed)
Testosterone is not something I manage.  She would likely need to see an endocrinologist though I will need to see the completed note from GYN prior to determining who she will need to see.

## 2018-08-11 NOTE — Telephone Encounter (Signed)
Patient called and says that she went to the GYN today. She says the provider checked her and talked about the cyst. She says they discussed if things worsens to go ahead with laproscopic surgery. She recommended to start on prescription testosterone. My question is this something Dr. Birdie Sons will prescribe or will I need to go somewhere else, like urology. I advised that once Dr. Birdie Sons receives the referral note, he will understand more of what her recommendations are. But in the meantime, I will go ahead and send this to him and someone will call back with his recommendation, she verbalized understanding.   Reason for Disposition . [1] Caller requesting NON-URGENT health information AND [2] PCP's office is the best resource  Protocols used: INFORMATION ONLY CALL-A-AH

## 2018-08-11 NOTE — Progress Notes (Signed)
  Subjective:     Patient ID: Allison Hudson, female   DOB: 1970/11/03, 48 y.o.   MRN: [id number]  HPI Reports ongoing pain over right ovary.this not new and has been happening for years, stating it is intermittent and not severe. Always tender on bi-manual exam.  Had CT scan last year and it was normal.   Was on testosterone supplements (not prescribed) until last year and feels like levels are low again. Feels very fatigued for months. Is losing hair and it is straight where it was very curly. Has lost strength and feels weaker in general. Can't do the weight lifting that she was able to do in the past. Can't sleep past a few hours with waking up around 2 am each morning but feels too fatigued to actually get up until 5-6 am. Had levels tested by PCP and there were in fact low. She desires restarting testosterone injections. Her FSH was also tested and she is not menopausal.  She denies pain with sex. Her last pap was in 2019.  Had a hysterectomy due to endometriosis at age 89. But Dr Barnabas Lister left both ovaries and her cervix per her request. It was done for dysparenia and endometriosis.    Review of Systems  Constitutional: Positive for activity change and fatigue.  Genitourinary: Positive for pelvic pain.  Neurological: Positive for weakness.  Psychiatric/Behavioral: Positive for sleep disturbance. The patient is nervous/anxious.   All other systems reviewed and are negative.      Objective:   Physical Exam A&Ox4 Well groomed female in no distress Blood pressure 111/68, pulse 61, height 5\' 2"  (1.575 m), weight 139 lb 8 oz (63.3 kg).  Body mass index is 25.51 kg/m.  Abdomen soft and not tender Pelvic exam: VAGINA: normal appearing vagina with normal color and discharge, no lesions, CERVIX: normal appearing cervix without discharge or lesions, cervical motion tenderness absent, multiparous os, UTERUS: absent, ADNEXA: normal adnexa in size, nontender and no masses. Skin warm and dry.  Normal hair distribution noted on head.    Assessment:     Intermittent RLQ pain Low testosterone Fatigue     Plan:     Discussed at length the common causes of abdominal and pelvic pain, especially in leu of her h/o endometriosis and possibility of re-occurrence, adhesions from hysterectomy, and bowel/bladder changes. Does not desire any follow up at this time.  Counseled on prescription testosterone and potential side effects, use and expected outcomes. We discussed normal testosterone levels in female and how that is much less than men. Explained that I do not prescribe IM testosterone and would recommend she see Carollee Herter at [facility] urological or her PCP if he is willing to prescribe/monitor it. She will follow up with her PCP at this time.  Discussed need for pap's every 5 years until the age of 30 per national guidelines since she still has a cervix.   > 50 % of visit spent in counseling on above.  Nasira Janusz,CNM

## 2018-08-18 ENCOUNTER — Encounter: Payer: Self-pay | Admitting: Obstetrics and Gynecology

## 2018-08-19 ENCOUNTER — Telehealth: Payer: Self-pay | Admitting: *Deleted

## 2018-08-19 NOTE — Telephone Encounter (Signed)
Pt called and stated she saw gynecologist that Dr. Birdie Sons referred her to and she has not heard anything back regarding the findings. She stated notes were went over to him on 2/202/20. She would like to know what she needs to do next. Please advise. CB#[phone]

## 2018-08-19 NOTE — Telephone Encounter (Signed)
Copied from CRM [id number]. Topic: General - Inquiry >> Aug 19, 2018  9:54 AM Lynne Logan D wrote: Reason for CRM: Pt called and stated she saw gynecologist that Dr. Birdie Sons referred her to and she has not heard anything back regarding the findings. She stated notes were went over to him on 2/202/20. She would like to know what she needs to do next. Please advise. CB#[phone]

## 2018-08-19 NOTE — Telephone Encounter (Signed)
Note reviewed.  I do not prescribe testosterone.  We would have to refer to urology and even then I do not know that they would prescribe testosterone for a woman.  I am happy to place a referral if she would like.

## 2018-08-24 ENCOUNTER — Other Ambulatory Visit: Payer: Self-pay | Admitting: Family

## 2018-08-24 ENCOUNTER — Telehealth: Payer: Self-pay | Admitting: Family

## 2018-08-24 DIAGNOSIS — R102 Pelvic and perineal pain: Secondary | ICD-10-CM

## 2018-08-24 NOTE — Telephone Encounter (Signed)
Referral placed urgent to urology Please triage - if patient has acute concerns, please advise appt with [place] or of course urgent care/ED

## 2018-08-24 NOTE — Telephone Encounter (Signed)
lmtcb explaining that Dr. Barrett Shell is out of office until Monday and we will call her once he returns.  Talbert Nan

## 2018-08-24 NOTE — Telephone Encounter (Signed)
Patient called into office upset because she had not heard from PCP as into a referral to urology for low testosterone explained to patient PCP waiting on patient response to referral. Patient stated she did not receive a call back concerning previous messag eleft to PCP , explained PCP respnded to message and that CMA had tried to call patient. Patient became angry and stated" this was unsatisfactory and that she was a former Production designer, theatre/television/film of an MD office " he went On vacation without reviewing my chart and .... referring me to a urologist. Ask, patient to calm down and tell me how I can help her and what results she wanted . Patient stated she wanted a referral to the Urologist mentioned By GYN , spoke with provider covering PCP and referral placed , patient notified,

## 2018-09-04 ENCOUNTER — Other Ambulatory Visit: Payer: Self-pay | Admitting: Family Medicine

## 2018-09-14 ENCOUNTER — Telehealth: Payer: Self-pay | Admitting: Family Medicine

## 2018-09-14 NOTE — Telephone Encounter (Signed)
Pt calling with questions regarding labs from 08/02/2018. Answered questions to pts satisfaction. Instructed to CB if any additional questions arise.

## 2018-09-14 NOTE — Telephone Encounter (Signed)
Copied from CRM [id number]. Topic: General - Other >> Sep 14, 2018 11:20 AM Jaquita Rector A wrote: Reason for CRM: Patient request a call back has questions about labs done on 08/02/2018. Call back # [phone]

## 2018-09-14 NOTE — Telephone Encounter (Signed)
Called pt and left message to call back.

## 2018-09-29 ENCOUNTER — Telehealth: Payer: Self-pay | Admitting: Family Medicine

## 2018-09-29 NOTE — Telephone Encounter (Signed)
Copied from CRM [id number]. Topic: General - Other >> Sep 29, 2018  4:49 PM Richarda Blade wrote: Reason for CRM: [facility] is requesting all of the patient's labs from jan 2019 until present day.   Fax Number [phone]

## 2018-10-03 NOTE — Telephone Encounter (Signed)
Labs have been faxed electronically. 

## 2018-10-04 ENCOUNTER — Ambulatory Visit: Payer: BLUE CROSS/BLUE SHIELD | Admitting: Urology

## 2018-10-12 ENCOUNTER — Telehealth: Payer: Self-pay | Admitting: Family Medicine

## 2018-10-12 DIAGNOSIS — R7989 Other specified abnormal findings of blood chemistry: Secondary | ICD-10-CM

## 2018-10-12 NOTE — Telephone Encounter (Signed)
Marchelle Folks from [facility] [phone] called regarding the pt testosterone range pt result came back low. Please call. Thank you!

## 2018-10-12 NOTE — Telephone Encounter (Signed)
The testosterone test was a general testosterone level that is not specific to females. There is not any biochemical criteria or guidelines to state that her value diagnoses her with androgen insufficiency. She has discussed this with her GYN who noted that testosterone levels are lower in women compared to men. I obtained this lab at the request of the patient due to her prior use of testosterone supplementation and other hormonal supplementation for body building.

## 2018-10-12 NOTE — Telephone Encounter (Signed)
Called and spoke with [facility] the doctor there wanted to now were the results based off man and female and what is consistor normal or just for female?

## 2018-10-12 NOTE — Telephone Encounter (Signed)
Please touch base with the patient regarding this as well. It appears that she was referred to urology for this. Please see if she was able to see them. If she was not we could consider having her see an endocrinologist to consider further evaluation and determine if she needs any treatment for her testosterone level. Thanks.

## 2018-10-12 NOTE — Telephone Encounter (Signed)
Called Marchelle Folks and left a detailed VM that regarding what Dr. Birdie Sons stated and left my name and call back number if needed.

## 2018-10-17 NOTE — Telephone Encounter (Signed)
Called pt and left a VM to call back. CRM created and sent to [facility] pool. 

## 2018-10-18 NOTE — Telephone Encounter (Signed)
LMTCB to relay the message that Dr. Barrett Shell stated.  Allison Hudson,cma

## 2018-10-20 NOTE — Telephone Encounter (Signed)
Called and spoke with pt. Pt stated that NO she did not go to UROLOGY and she will need the referral placed to see endocrinologist. Pt is aware that PCP is out and will return may 4 pt is OK with waiting until PCP returns to have referral placed.

## 2018-10-23 NOTE — Telephone Encounter (Signed)
Referral placed.

## 2018-11-09 ENCOUNTER — Ambulatory Visit: Payer: BLUE CROSS/BLUE SHIELD | Admitting: Family Medicine

## 2018-11-18 ENCOUNTER — Encounter: Payer: BLUE CROSS/BLUE SHIELD | Admitting: Obstetrics and Gynecology

## 2018-12-19 ENCOUNTER — Other Ambulatory Visit: Payer: Self-pay | Admitting: Family Medicine

## 2019-03-22 ENCOUNTER — Other Ambulatory Visit: Payer: Self-pay | Admitting: Family Medicine

## 2019-07-07 ENCOUNTER — Other Ambulatory Visit: Payer: Self-pay | Admitting: Family Medicine

## 2019-07-07 NOTE — Telephone Encounter (Signed)
30-day refill sent in.  Patient has not been seen in almost a year.  Please contact her to get her scheduled for follow-up.

## 2019-07-11 NOTE — Telephone Encounter (Signed)
I called and scheduled a patient for a f/up because she has not been in to see the provider in 1 year.  Delno Blaisdell,cma

## 2019-08-16 ENCOUNTER — Ambulatory Visit: Payer: BLUE CROSS/BLUE SHIELD | Admitting: Family Medicine

## 2019-11-24 IMAGING — CT CT ABD-PELV W/ CM
2 of 5 series · 16 of 46 positions shown, 18 images · IV contrast (iopamidol)
Comparison: None.

CLINICAL DATA: 46-year-old female with RIGHT abdominal and pelvic
pain and lump for 3 months.

EXAM:
CT ABDOMEN AND PELVIS WITH CONTRAST
TECHNIQUE: Multidetector CT imaging of the abdomen and pelvis was performed
using the standard protocol following bolus administration of
intravenous contrast.
CONTRAST:  75mL [id number] IOPAMIDOL ([id number]) INJECTION 61%

[Series 2: abd pelvis · axial · 0.65mm/px · z∈[-1504,-1169]mm · 13 of 77 slices shown, 15 images (1 of 2)]
[im 5/77  soft-tissue]
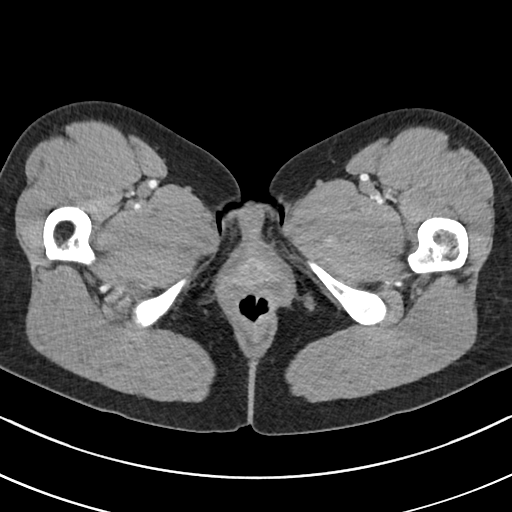
[im 5/77  bone]
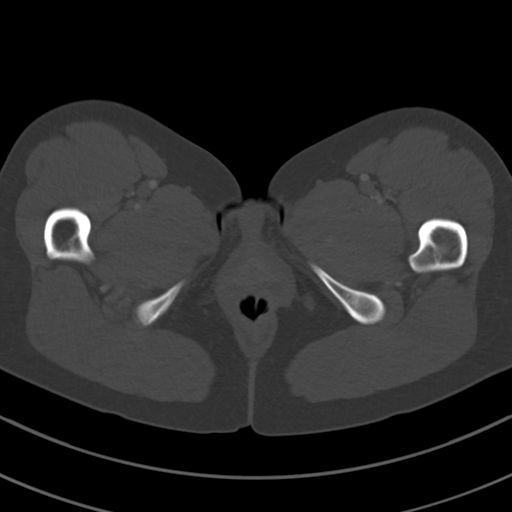
[im 9/77  soft-tissue]
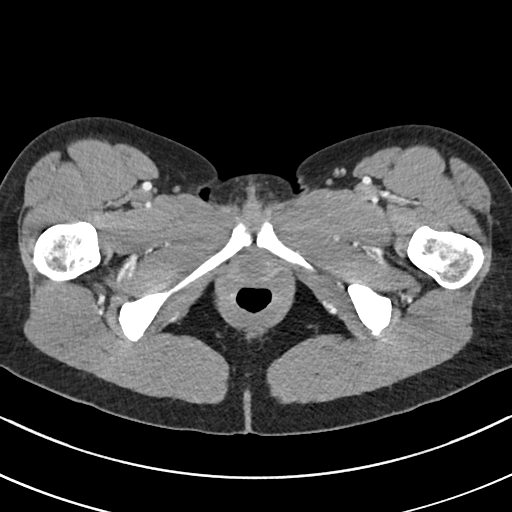
[im 18/77  soft-tissue]
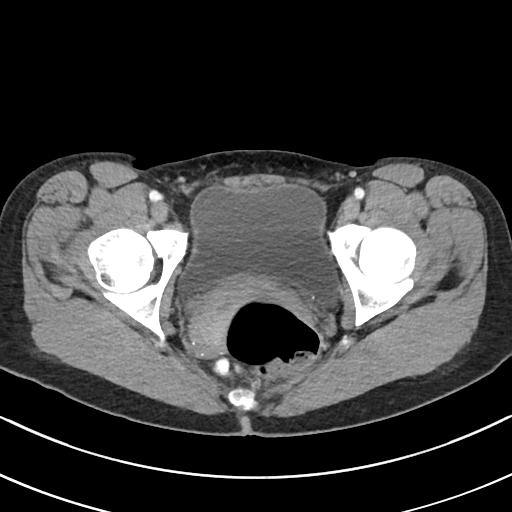
[im 23/77  soft-tissue]
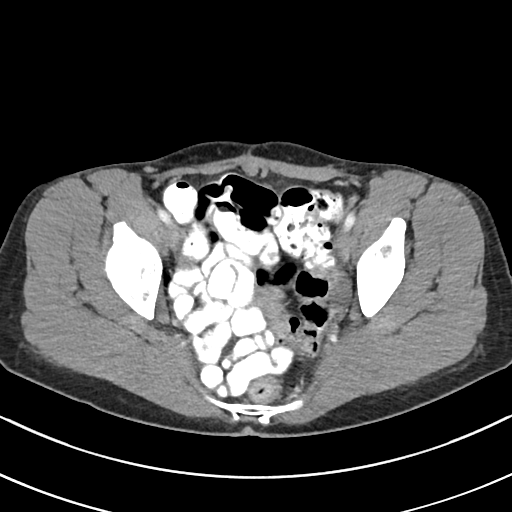
[im 27/77  soft-tissue]
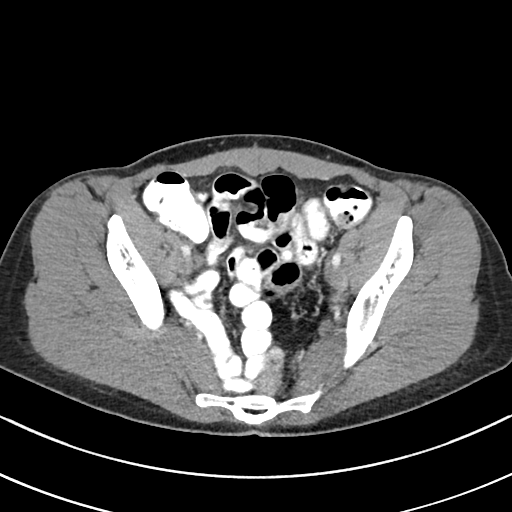
[im 32/77  soft-tissue]
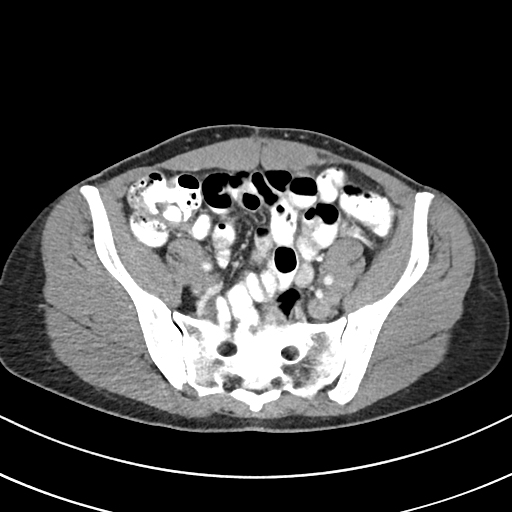
[im 41/77  soft-tissue]
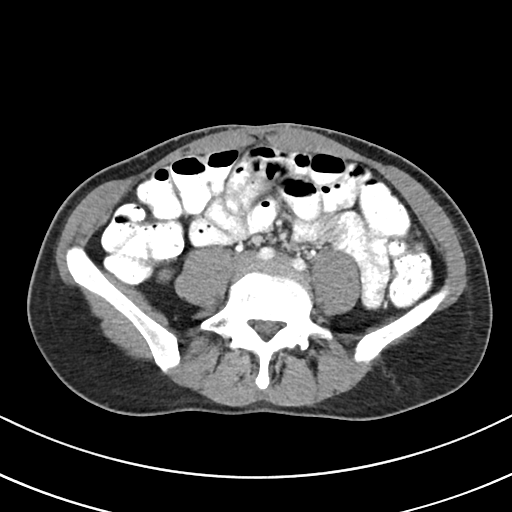
[im 45/77  soft-tissue]
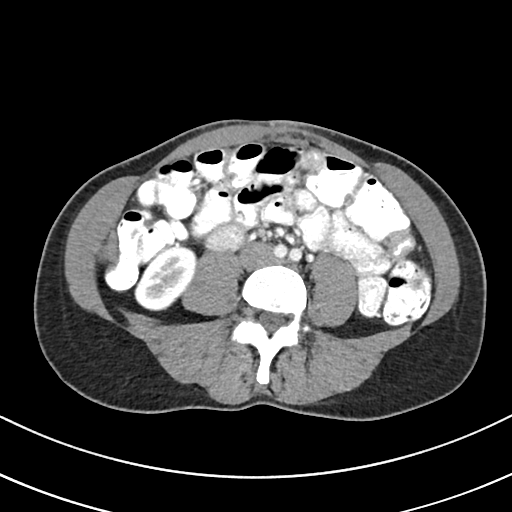
[im 50/77  soft-tissue]
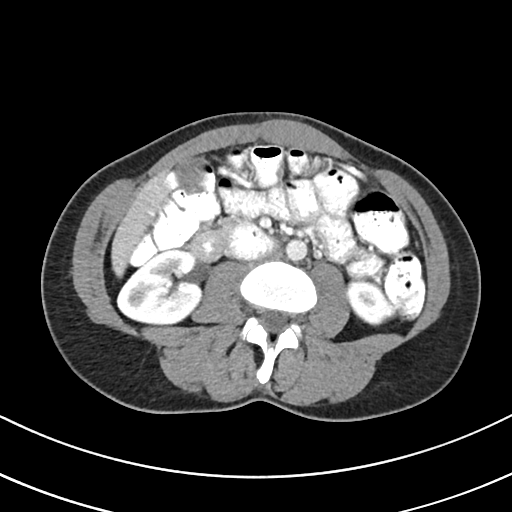
[im 50/77  bone]
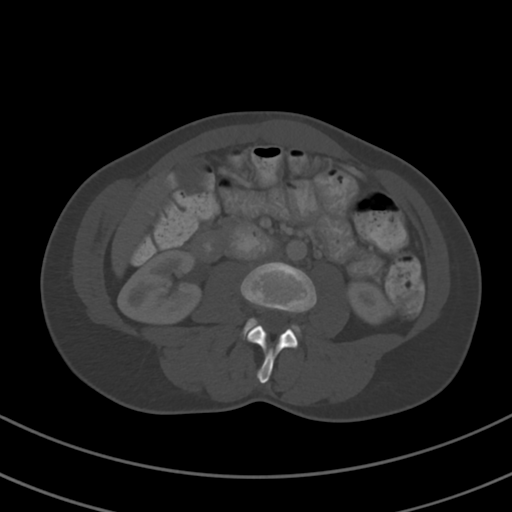
[im 54/77  soft-tissue]
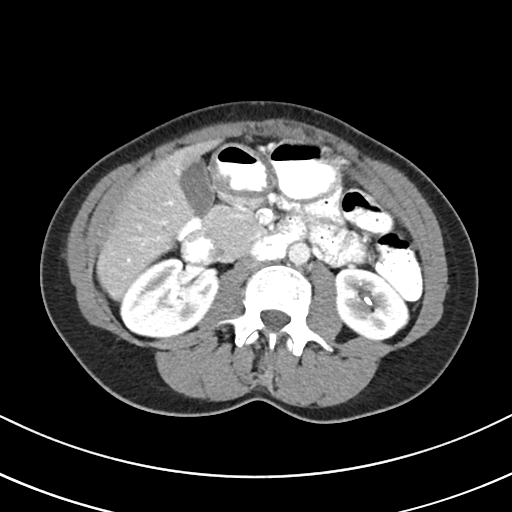
[im 59/77  soft-tissue]
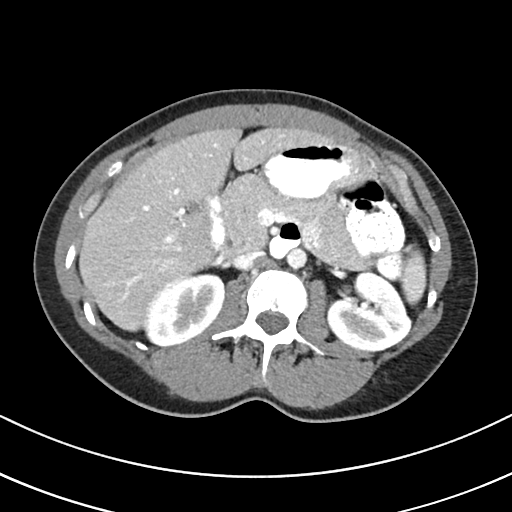
[im 68/77  soft-tissue]
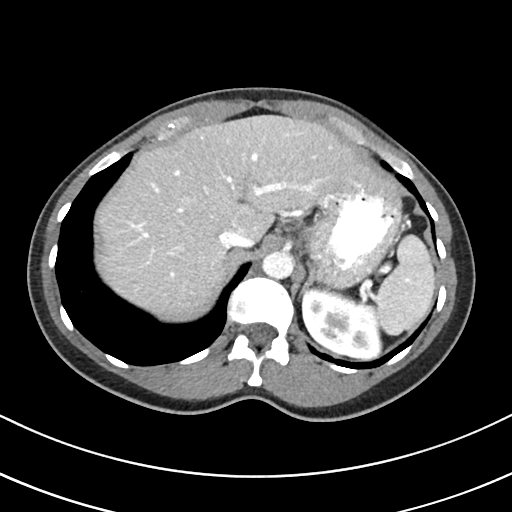
[im 72/77  soft-tissue]
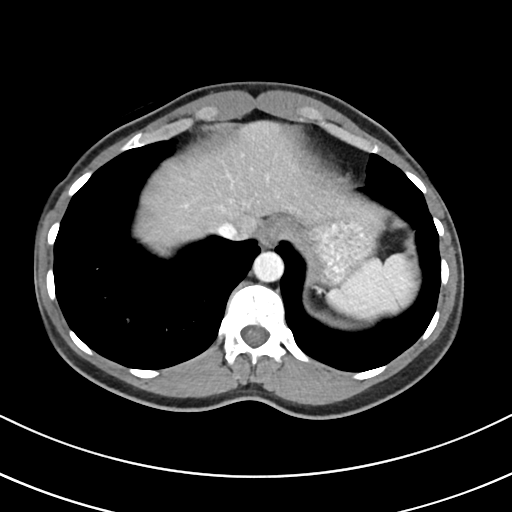

[Series 4: abd pelvis · coronal · 0.61mm/px · 3 of 120 slices shown (2 of 2)]
[im 40/120  soft-tissue]
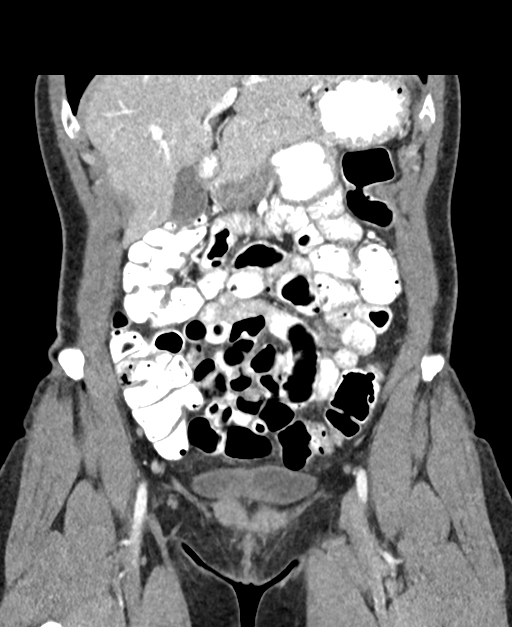
[im 53/120  soft-tissue]
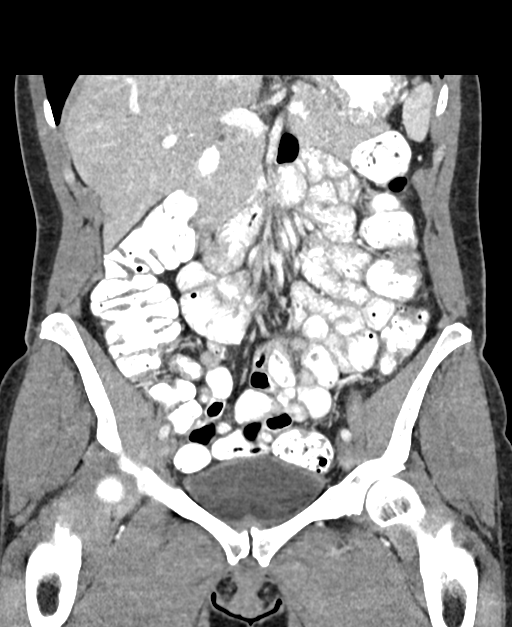
[im 67/120  soft-tissue]
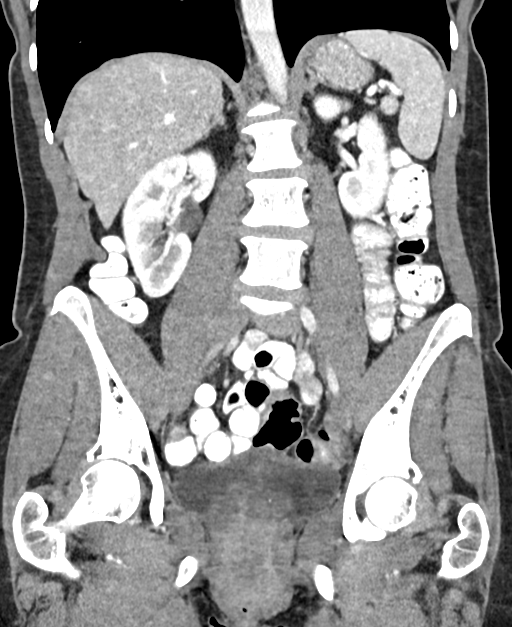

[16 of 46 positions shown; findings below may reference images not displayed]

FINDINGS: Lower chest: Unremarkable

Hepatobiliary: The liver and gallbladder are unremarkable. No
biliary dilatation.

Pancreas: Unremarkable

Spleen: Unremarkable

Adrenals/Urinary Tract: A 7 mm nonobstructing LEFT LOWER pole renal
calculus and a 2 mm nonobstructing mid LEFT renal calculus noted. No
other renal, adrenal or bladder abnormalities noted.

Stomach/Bowel: Stomach is within normal limits. Appendix appears
normal. No evidence of bowel wall thickening, distention, or
inflammatory changes.

Vascular/Lymphatic: No significant vascular findings are present. No
enlarged abdominal or pelvic lymph nodes.

Reproductive: Status post hysterectomy. No adnexal masses.

Other: No ascites, focal collection or pneumoperitoneum. No
abdominal wall hernia identified.

Musculoskeletal: No acute abnormality or suspicious bony lesions.
Mild apex LEFT lumbar scoliosis is noted.
IMPRESSION: 1. No findings to suggest a cause for this patient's RIGHT
abdominal/pelvic pain.
2. Nonobstructing LEFT renal calculi.

## 2020-01-15 ENCOUNTER — Other Ambulatory Visit: Payer: Self-pay

## 2020-01-15 ENCOUNTER — Encounter: Payer: Self-pay | Admitting: Family Medicine

## 2020-01-15 ENCOUNTER — Ambulatory Visit (INDEPENDENT_AMBULATORY_CARE_PROVIDER_SITE_OTHER): Payer: BC Managed Care – PPO | Admitting: Family Medicine

## 2020-01-15 VITALS — Ht 62.0 in | Wt 125.0 lb

## 2020-01-15 DIAGNOSIS — Z23 Encounter for immunization: Secondary | ICD-10-CM

## 2020-01-15 DIAGNOSIS — R059 Cough, unspecified: Secondary | ICD-10-CM | POA: Insufficient documentation

## 2020-01-15 DIAGNOSIS — R05 Cough: Secondary | ICD-10-CM

## 2020-01-15 MED ORDER — BENZONATATE 200 MG PO CAPS: 200.0000 mg | ORAL_CAPSULE | Freq: Two times a day (BID) | ORAL | 0 refills | Status: DC | PRN

## 2020-01-15 NOTE — Progress Notes (Signed)
Virtual Visit via video Note  This visit type was conducted due to national recommendations for restrictions regarding the COVID-19 pandemic (e.g. social distancing).  This format is felt to be most appropriate for this patient at this time.  All issues noted in this document were discussed and addressed.  No physical exam was performed (except for noted visual exam findings with Video Visits).   I connected with Allison Hudson today at  9:00 AM EDT by a video enabled telemedicine application and verified that I am speaking with the correct person using two identifiers. Location patient: home Location provider: work Persons participating in the virtual visit: patient, provider  I discussed the limitations, risks, security and privacy concerns of performing an evaluation and management service by telephone and the availability of in person appointments. I also discussed with the patient that there may be a patient responsible charge related to this service. The patient expressed understanding and agreed to proceed.  Reason for visit: same day visit.  HPI: Cough: Symptoms have been going on for 6 days.  Cough is productive of some yellowish mucus.  She also has sore throat.  Has rhinorrhea.  Congestion is mostly in her chest.  Makes it a little difficult to breathe.  She has felt feverish.  She has had a hard time smelling though has not lost her taste.  She does note mild rash across her chest starting yesterday.  No ibuprofen or Tylenol.  No Covid vaccine.  She works in [employer] and has likely been exposed to COVID-19.  She initially thought her symptoms were allergy related and was using a Nettie pot.  Mucinex seems to be beneficial.   ROS: See pertinent positives and negatives per HPI.  Past Medical History:  Diagnosis Date  . Depression   . Frequent headaches     Past Surgical History:  Procedure Laterality Date  . ABDOMINAL HYSTERECTOMY      No family history on  file.  SOCIAL HX: Non-smoker   Current Outpatient Medications:  .  Cholecalciferol (VITAMIN D3) 1000 units CAPS, Take 8,000 Units by mouth. , Disp: , Rfl:  .  Multiple Vitamin (MULTI-VITAMINS) TABS, Take by mouth., Disp: , Rfl:  .  benzonatate (TESSALON) 200 MG capsule, Take 1 capsule (200 mg total) by mouth 2 (two) times daily as needed for cough., Disp: 20 capsule, Rfl: 0 .  citalopram (CELEXA) 40 MG tablet, Take 1 tablet (40 mg total) by mouth daily. Please call to schedule an appointment for further refills. (Patient not taking: Reported on 01/15/2020), Disp: 30 tablet, Rfl: 0 .  FISH OIL-KRILL OIL PO, Take 6,000 mg by mouth. (Patient not taking: Reported on 01/15/2020), Disp: , Rfl:  .  Magnesium 500 MG CAPS, Take by mouth. (Patient not taking: Reported on 01/15/2020), Disp: , Rfl:   EXAM:  VITALS per patient if applicable:  GENERAL: alert, oriented, appears well and in no acute distress  HEENT: atraumatic, conjunttiva clear, no obvious abnormalities on inspection of external nose and ears  NECK: normal movements of the head and neck  LUNGS: on inspection no signs of respiratory distress, breathing rate appears normal, no obvious gross SOB, gasping or wheezing  CV: no obvious cyanosis  MS: moves all visible extremities without noticeable abnormality  PSYCH/NEURO: pleasant and cooperative, no obvious depression or anxiety, speech and thought processing grossly intact  ASSESSMENT AND PLAN:  Discussed the following assessment and plan:  Cough Symptoms are concerning for COVID-19.  I advised her to get  tested and quarantine at least until her test result returns.  Given website to be tested through [facility] health.  Discussed if her test result was negative we could consider antibiotics for possible bacterial infection given that she will have had symptoms for at least a week by then.  She can continue Mucinex.  Discussed Benadryl for any runny nose.  Advised that she could use Flonase  as well.  Tessalon sent to the pharmacy for cough.  Advised to seek medical attention for shortness of breath, cough productive of blood, or chest pain.   No orders of the defined types were placed in this encounter.   Meds ordered this encounter  Medications  . benzonatate (TESSALON) 200 MG capsule    Sig: Take 1 capsule (200 mg total) by mouth 2 (two) times daily as needed for cough.    Dispense:  20 capsule    Refill:  0     I discussed the assessment and treatment plan with the patient. The patient was provided an opportunity to ask questions and all were answered. The patient agreed with the plan and demonstrated an understanding of the instructions.   The patient was advised to call back or seek an in-person evaluation if the symptoms worsen or if the condition fails to improve as anticipated.   Marikay Alar, MD

## 2020-01-15 NOTE — Assessment & Plan Note (Signed)
Symptoms are concerning for COVID-19.  I advised her to get tested and quarantine at least until her test result returns.  Given website to be tested through [facility] health.  Discussed if her test result was negative we could consider antibiotics for possible bacterial infection given that she will have had symptoms for at least a week by then.  She can continue Mucinex.  Discussed Benadryl for any runny nose.  Advised that she could use Flonase as well.  Tessalon sent to the pharmacy for cough.  Advised to seek medical attention for shortness of breath, cough productive of blood, or chest pain.

## 2020-01-16 ENCOUNTER — Telehealth: Payer: Self-pay | Admitting: Family Medicine

## 2020-01-16 MED ORDER — AMOXICILLIN-POT CLAVULANATE 875-125 MG PO TABS: 1.0000 | ORAL_TABLET | Freq: Two times a day (BID) | ORAL | 0 refills | Status: DC

## 2020-01-16 NOTE — Telephone Encounter (Signed)
I called and spoke with the patient and informed her that the antibiotic was sent to the pharmacy. Patient stated she has picked it up.  Batu Cassin,cma

## 2020-01-16 NOTE — Telephone Encounter (Signed)
Pt called in stated that she had talked with Dr.Sonnenberg about antibiotics and the pharmacy did not have the prescription for them.  Treacy Holcomb,cma

## 2020-01-16 NOTE — Telephone Encounter (Signed)
Augmentin sent to pharmacy. If not improving on this we can complete a follow-up visit.

## 2020-01-16 NOTE — Telephone Encounter (Signed)
I wanted her to have a COVID19 test first as it had not been enough days of symptoms to warrant antibiotics and her symptoms are concerning for COVID19. If her test is negative and she is continuing to have symptoms then we can consider antibiotics. Please find out if she went to have a test completed. Thanks.

## 2020-01-16 NOTE — Telephone Encounter (Signed)
I called and spoke with the patient and she stated she had the covid test and it came back negative today and she is still having congestion and it is thick and yellow.  She is also taking the cough medication and using the nettie pot.  Anju Sereno,cma

## 2020-01-16 NOTE — Telephone Encounter (Signed)
Pt called in stated that she had talked with Dr.Sonnenberg about antibiotics and the pharmacy did not have the prescription for them

## 2020-01-18 ENCOUNTER — Other Ambulatory Visit: Payer: BC Managed Care – PPO

## 2020-02-29 ENCOUNTER — Telehealth: Payer: Self-pay | Admitting: Family Medicine

## 2020-02-29 NOTE — Telephone Encounter (Signed)
pt is having lower back pain and is having darker than normal urine  Pt wanted to come in and do a urine  No appts available with provider

## 2020-03-06 NOTE — Telephone Encounter (Signed)
LMTCB

## 2020-03-15 NOTE — Telephone Encounter (Signed)
Patient treated for UTI  Allison Hudson,cma

## 2020-04-03 ENCOUNTER — Telehealth: Payer: Self-pay

## 2020-04-03 NOTE — Telephone Encounter (Signed)
Pt states that she needs a physical before the end of October.-Pt states that she is going to call every day to see if we have any cancellations and wants a message sent to PCP as well. I cannot find anywhere to put this patient, please advise.  Kyrsten Deleeuw,cma

## 2020-04-03 NOTE — Telephone Encounter (Signed)
Pt states that she needs a physical before the end of October.-Pt states that she is going to call every day to see if we have any cancellations and wants a message sent to PCP as well. Please advise

## 2020-04-04 NOTE — Telephone Encounter (Signed)
If Dr Kathie Rhodes does not have room and I do,  You can add her to my schedule just for the CPE

## 2020-04-08 NOTE — Telephone Encounter (Signed)
I have a 9 am appointment on 04/12/20.

## 2020-04-12 ENCOUNTER — Encounter: Payer: Self-pay | Admitting: Family Medicine

## 2020-04-12 ENCOUNTER — Ambulatory Visit (INDEPENDENT_AMBULATORY_CARE_PROVIDER_SITE_OTHER): Payer: BC Managed Care – PPO | Admitting: Family Medicine

## 2020-04-12 ENCOUNTER — Other Ambulatory Visit (HOSPITAL_COMMUNITY)
Admission: RE | Admit: 2020-04-12 | Discharge: 2020-04-12 | Disposition: A | Discharge status: Home / Self Care | Payer: BC Managed Care – PPO | Source: Ambulatory Visit | Attending: Family Medicine | Admitting: Family Medicine

## 2020-04-12 ENCOUNTER — Other Ambulatory Visit: Payer: Self-pay

## 2020-04-12 VITALS — BP 100/60 | HR 73 | Temp 98.6°F | Ht 61.5 in | Wt 126.6 lb

## 2020-04-12 DIAGNOSIS — Z1329 Encounter for screening for other suspected endocrine disorder: Secondary | ICD-10-CM

## 2020-04-12 DIAGNOSIS — Z13220 Encounter for screening for lipoid disorders: Secondary | ICD-10-CM | POA: Diagnosis not present

## 2020-04-12 DIAGNOSIS — Z1231 Encounter for screening mammogram for malignant neoplasm of breast: Secondary | ICD-10-CM

## 2020-04-12 DIAGNOSIS — Z0000 Encounter for general adult medical examination without abnormal findings: Secondary | ICD-10-CM | POA: Insufficient documentation

## 2020-04-12 DIAGNOSIS — Z0001 Encounter for general adult medical examination with abnormal findings: Secondary | ICD-10-CM | POA: Insufficient documentation

## 2020-04-12 DIAGNOSIS — Z124 Encounter for screening for malignant neoplasm of cervix: Secondary | ICD-10-CM | POA: Insufficient documentation

## 2020-04-12 DIAGNOSIS — Z1211 Encounter for screening for malignant neoplasm of colon: Secondary | ICD-10-CM

## 2020-04-12 DIAGNOSIS — N941 Unspecified dyspareunia: Secondary | ICD-10-CM | POA: Diagnosis not present

## 2020-04-12 DIAGNOSIS — N888 Other specified noninflammatory disorders of cervix uteri: Secondary | ICD-10-CM

## 2020-04-12 DIAGNOSIS — Z130 Encounter for screening for diseases of the blood and blood-forming organs and certain disorders involving the immune mechanism: Secondary | ICD-10-CM | POA: Diagnosis not present

## 2020-04-12 LAB — COMPREHENSIVE METABOLIC PANEL
ALT: 15 U/L (ref 0–35)
AST: 17 U/L (ref 0–37)
Albumin: 4.3 g/dL (ref 3.5–5.2)
Alkaline Phosphatase: 67 U/L (ref 39–117)
BUN: 19 mg/dL (ref 6–23)
CO2: 31 mEq/L (ref 19–32)
Calcium: 9 mg/dL (ref 8.4–10.5)
Chloride: 103 mEq/L (ref 96–112)
Creatinine, Ser: 0.77 mg/dL (ref 0.40–1.20)
GFR: 91.16 mL/min (ref >60.00)
Glucose, Bld: 71 mg/dL (ref 70–99)
Potassium: 4.5 mEq/L (ref 3.5–5.1)
Sodium: 140 mEq/L (ref 135–145)
Total Bilirubin: 0.4 mg/dL (ref 0.2–1.2)
Total Protein: 6.5 g/dL (ref 6.0–8.3)

## 2020-04-12 LAB — LIPID PANEL
Cholesterol: 200 mg/dL (ref 0–200)
HDL: 86 mg/dL (ref >39.00)
LDL Cholesterol: 100 mg/dL — ABNORMAL HIGH (ref 0–99)
NonHDL: 113.97
Total CHOL/HDL Ratio: 2
Triglycerides: 72 mg/dL (ref 0.0–149.0)
VLDL: 14.4 mg/dL (ref 0.0–40.0)

## 2020-04-12 LAB — CBC
HCT: 40.2 % (ref 36.0–46.0)
Hemoglobin: 13.6 g/dL (ref 12.0–15.0)
MCHC: 33.9 g/dL (ref 30.0–36.0)
MCV: 94.6 fl (ref 78.0–100.0)
Platelets: 238 10*3/uL (ref 150.0–400.0)
RBC: 4.25 Mil/uL (ref 3.87–5.11)
RDW: 13.4 % (ref 11.5–15.5)
WBC: 5.8 10*3/uL (ref 4.0–10.5)

## 2020-04-12 LAB — TSH: TSH: 0.61 u[IU]/mL (ref 0.35–4.50)

## 2020-04-12 NOTE — Progress Notes (Signed)
Tommi Rumps, MD Phone: [phone]  Allison Hudson is a 49 y.o. female who presents today for CPE.  Diet: clean and healthy Exercise: 3-4 days per week Pap smear: patient requests today, last was 08/30/17 NILM, neg HPV Colonoscopy: due Mammogram: due Family history-  Colon cancer: no  Breast cancer: no  Ovarian cancer: no Menses: s/p hysterectomy Vaccines-   Flu: declines  Tetanus: declines  COVID19: declines HIV screening: declines Hep C Screening: declines Tobacco use: no Alcohol use: 7-8/GNFA Illicit Drug use: no Dentist: yes Ophthalmology: due Patient does report some discomfort in her suprapubic region on intercourse.  This has been an ongoing issue over the years though seems to have worsened some recently.   Active Ambulatory Problems    Diagnosis Date Noted  . Anxiety and depression 07/02/2016  . Insomnia 07/02/2016  . Encounter for monitoring testosterone replacement therapy 03/02/2017  . Pelvic pain 08/30/2017  . Fatigue 08/02/2018  . Elevated BP without diagnosis of hypertension 08/02/2018  . Cough 01/15/2020  . Encounter for general adult medical examination with abnormal findings 04/12/2020  . Dyspareunia in female 04/12/2020   Resolved Ambulatory Problems    Diagnosis Date Noted  . Influenza-like illness 07/02/2016   Past Medical History:  Diagnosis Date  . Depression   . Frequent headaches     No family history on file.  Social History   Socioeconomic History  . Marital status: Single    Spouse name: Not on file  . Number of children: Not on file  . Years of education: Not on file  . Highest education level: Not on file  Occupational History  . Not on file  Tobacco Use  . Smoking status: Never Smoker  . Smokeless tobacco: Never Used  Vaping Use  . Vaping Use: Never used  Substance and Sexual Activity  . Alcohol use: Yes    Alcohol/week: 3.0 standard drinks    Types: 3 Standard drinks or equivalent per week  . Drug use: No    . Sexual activity: Yes    Partners: Male  Other Topics Concern  . Not on file  Social History Narrative  . Not on file   Social Determinants of Health   Financial Resource Strain:   . Difficulty of Paying Living Expenses: Not on file  Food Insecurity:   . Worried About Charity fundraiser in the Last Year: Not on file  . Ran Out of Food in the Last Year: Not on file  Transportation Needs:   . Lack of Transportation (Medical): Not on file  . Lack of Transportation (Non-Medical): Not on file  Physical Activity:   . Days of Exercise per Week: Not on file  . Minutes of Exercise per Session: Not on file  Stress:   . Feeling of Stress : Not on file  Social Connections:   . Frequency of Communication with Friends and Family: Not on file  . Frequency of Social Gatherings with Friends and Family: Not on file  . Attends Religious Services: Not on file  . Active Member of Clubs or Organizations: Not on file  . Attends Archivist Meetings: Not on file  . Marital Status: Not on file  Intimate Partner Violence:   . Fear of Current or Ex-Partner: Not on file  . Emotionally Abused: Not on file  . Physically Abused: Not on file  . Sexually Abused: Not on file    ROS  General:  Negative for nexplained weight loss, fever Skin: Negative for  new or changing mole, sore that won't heal HEENT: Negative for trouble hearing, trouble seeing, ringing in ears, mouth sores, hoarseness, change in voice, dysphagia. CV:  Negative for chest pain, dyspnea, edema, palpitations Resp: Negative for cough, dyspnea, hemoptysis GI: Negative for nausea, vomiting, diarrhea, constipation, abdominal pain, melena, hematochezia. GU: Positive for sexual difficulty, negative for dysuria, incontinence, urinary hesitance, hematuria, vaginal or penile discharge, polyuria, lumps in testicle or breasts MSK: Negative for muscle cramps or aches, joint pain or swelling Neuro: Negative for headaches, weakness,  numbness, dizziness, passing out/fainting Psych: Negative for depression, anxiety, memory problems  Objective  Physical Exam Vitals:   04/12/20 0904  BP: 100/60  Pulse: 73  Temp: 98.6 F (37 C)  SpO2: 99%    BP Readings from Last 3 Encounters:  04/12/20 100/60  08/11/18 111/68  08/02/18 106/60   Wt Readings from Last 3 Encounters:  04/12/20 126 lb 9.6 oz (57.4 kg)  01/15/20 125 lb (56.7 kg)  08/11/18 139 lb 8 oz (63.3 kg)    Physical Exam Constitutional:      General: She is not in acute distress.    Appearance: She is not diaphoretic.  HENT:     Head: Normocephalic and atraumatic.  Eyes:     Conjunctiva/sclera: Conjunctivae normal.     Pupils: Pupils are equal, round, and reactive to light.  Cardiovascular:     Rate and Rhythm: Normal rate and regular rhythm.     Heart sounds: Normal heart sounds.  Pulmonary:     Effort: Pulmonary effort is normal.     Breath sounds: Normal breath sounds.  Abdominal:     General: Bowel sounds are normal. There is no distension.     Palpations: Abdomen is soft.     Tenderness: There is no abdominal tenderness. There is no guarding or rebound.  Genitourinary:    Comments: Fulton Mole, CMA served as chaperone, normal labia, normal vaginal mucosa, there is an apparent cyst on her cervix at the 9 o'clock position, no cervical motion tenderness, no discomfort on bimanual exam, no adnexal masses or tenderness, bilateral breasts with implants, no tenderness, masses, skin changes, or nipple inversion bilaterally, no axillary masses bilaterally Musculoskeletal:     Right lower leg: No edema.     Left lower leg: No edema.  Skin:    General: Skin is warm and dry.  Neurological:     Mental Status: She is alert.  Psychiatric:        Mood and Affect: Mood normal.      Assessment/Plan:   Problem List Items Addressed This Visit    Dyspareunia in female    Refer to GYN.  She does have a cyst on her cervix which could be playing some  role though no other abnormalities were noted on pelvic exam.      Relevant Orders   Ambulatory referral to Gynecology   Encounter for general adult medical examination with abnormal findings - Primary    Physical exam completed.  Encouraged continued healthy diet and exercise.  Pap smear completed at patient request.  Referral for colonoscopy placed.  Order for mammogram placed.  The patient knows she needs to call to schedule this.  Patient declines tetanus vaccine, flu vaccine, and COVID-19 vaccine.  I discussed safety and efficacy of the COVID-19 vaccine and encouraged her to continue to consider this.  Encouraged her to see the eye doctor as well for yearly follow-up.  Patient declines hepatitis C and HIV screening and notes she  is low risk.  Lab work as outlined.       Other Visit Diagnoses    Lipid screening       Relevant Orders   Comp Met (CMET)   Lipid panel   Screening for deficiency anemia       Relevant Orders   CBC   Thyroid disorder screen       Relevant Orders   TSH   Cyst of cervix       Relevant Orders   Ambulatory referral to Gynecology   Cervical cancer screening       Relevant Orders   Cytology - PAP( [place])   Encounter for screening mammogram for malignant neoplasm of breast       Relevant Orders   MM 3D SCREEN BREAST BILATERAL   Colon cancer screening       Relevant Orders   Ambulatory referral to Gastroenterology      This visit occurred during the SARS-CoV-2 public health emergency.  Safety protocols were in place, including screening questions prior to the visit, additional usage of staff PPE, and extensive cleaning of exam room while observing appropriate contact time as indicated for disinfecting solutions.    Tommi Rumps, MD [place]

## 2020-04-12 NOTE — Patient Instructions (Signed)
Nice to see you. We will check lab work today. Continue to work on [employer] and exercise. We will refer you to gynecology for your pain with intercourse and the cyst on your cervix. Please call to schedule your mammogram.  The phone number is [phone]. Somebody will call you from GI to schedule your colonoscopy.

## 2020-04-12 NOTE — Assessment & Plan Note (Signed)
Refer to GYN.  She does have a cyst on her cervix which could be playing some role though no other abnormalities were noted on pelvic exam.

## 2020-04-12 NOTE — Assessment & Plan Note (Signed)
Physical exam completed.  Encouraged continued healthy diet and exercise.  Pap smear completed at patient request.  Referral for colonoscopy placed.  Order for mammogram placed.  The patient knows she needs to call to schedule this.  Patient declines tetanus vaccine, flu vaccine, and COVID-19 vaccine.  I discussed safety and efficacy of the COVID-19 vaccine and encouraged her to continue to consider this.  Encouraged her to see the eye doctor as well for yearly follow-up.  Patient declines hepatitis C and HIV screening and notes she is low risk.  Lab work as outlined.

## 2020-04-15 LAB — CYTOLOGY - PAP
Comment: NEGATIVE
Diagnosis: NEGATIVE
High risk HPV: NEGATIVE

## 2020-04-18 ENCOUNTER — Telehealth: Payer: Self-pay

## 2020-04-18 NOTE — Telephone Encounter (Signed)
-----   Message from Glori Luis, MD sent at 04/18/2020  9:15 AM EDT ----- Please let her know her pap smear was acceptable. She needs to see GYN as we discussed at her visit for the possible cyst on her cervix.

## 2020-04-22 ENCOUNTER — Telehealth (INDEPENDENT_AMBULATORY_CARE_PROVIDER_SITE_OTHER): Payer: Self-pay | Admitting: Gastroenterology

## 2020-04-22 ENCOUNTER — Other Ambulatory Visit: Payer: Self-pay

## 2020-04-22 DIAGNOSIS — Z1211 Encounter for screening for malignant neoplasm of colon: Secondary | ICD-10-CM

## 2020-04-22 MED ORDER — NA SULFATE-K SULFATE-MG SULF 17.5-3.13-1.6 GM/177ML PO SOLN
1.0000 | Freq: Once | ORAL | 0 refills | Status: AC
Start: 2020-04-22 — End: 2020-04-22

## 2020-04-22 NOTE — Progress Notes (Signed)
Gastroenterology Pre-Procedure Review  Request Date: 05/06/20 Requesting Physician: Dr. Servando Snare  PATIENT REVIEW QUESTIONS: The patient responded to the following health history questions as indicated:    1. Are you having any GI issues? no 2. Do you have a personal history of Polyps? no 3. Do you have a family history of Colon Cancer or Polyps? no 4. Diabetes Mellitus? no 5. Joint replacements in the past 12 months?no 6. Major health problems in the past 3 months?no 7. Any artificial heart valves, MVP, or defibrillator?no    MEDICATIONS & ALLERGIES:    Patient reports the following regarding taking any anticoagulation/antiplatelet therapy:   Plavix, Coumadin, Eliquis, Xarelto, Lovenox, Pradaxa, Brilinta, or Effient? no Aspirin? no  Patient confirms/reports the following medications:  Current Outpatient Medications  Medication Sig Dispense Refill  . amoxicillin-clavulanate (AUGMENTIN) 875-125 MG tablet Take 1 tablet by mouth 2 (two) times daily. (Patient not taking: Reported on 04/22/2020) 14 tablet 0  . benzonatate (TESSALON) 200 MG capsule Take 1 capsule (200 mg total) by mouth 2 (two) times daily as needed for cough. (Patient not taking: Reported on 04/22/2020) 20 capsule 0  . Cholecalciferol (VITAMIN D3) 1000 units CAPS Take 8,000 Units by mouth.  (Patient not taking: Reported on 04/22/2020)    . citalopram (CELEXA) 40 MG tablet Take 1 tablet (40 mg total) by mouth daily. Please call to schedule an appointment for further refills. (Patient not taking: Reported on 01/15/2020) 30 tablet 0  . FISH OIL-KRILL OIL PO Take 6,000 mg by mouth. (Patient not taking: Reported on 01/15/2020)    . Magnesium 500 MG CAPS Take by mouth. (Patient not taking: Reported on 01/15/2020)    . Multiple Vitamin (MULTI-VITAMINS) TABS Take by mouth. (Patient not taking: Reported on 04/22/2020)     No current facility-administered medications for this visit.    Patient confirms/reports the following allergies:  No  Known Allergies  No orders of the defined types were placed in this encounter.   AUTHORIZATION INFORMATION Primary Insurance: 1D#: Group #:  Secondary Insurance: 1D#: Group #:  SCHEDULE INFORMATION: Date: 05/06/20 Time: Location:[facility]

## 2020-04-30 ENCOUNTER — Other Ambulatory Visit (INDEPENDENT_AMBULATORY_CARE_PROVIDER_SITE_OTHER): Payer: BC Managed Care – PPO

## 2020-04-30 ENCOUNTER — Other Ambulatory Visit: Payer: Self-pay

## 2020-04-30 ENCOUNTER — Encounter: Payer: Self-pay | Admitting: Obstetrics and Gynecology

## 2020-04-30 ENCOUNTER — Ambulatory Visit (INDEPENDENT_AMBULATORY_CARE_PROVIDER_SITE_OTHER): Payer: BC Managed Care – PPO | Admitting: Obstetrics and Gynecology

## 2020-04-30 VITALS — BP 117/77 | HR 98 | Ht 61.5 in | Wt 129.6 lb

## 2020-04-30 DIAGNOSIS — N888 Other specified noninflammatory disorders of cervix uteri: Secondary | ICD-10-CM

## 2020-04-30 DIAGNOSIS — N9419 Other specified dyspareunia: Secondary | ICD-10-CM

## 2020-04-30 DIAGNOSIS — R102 Pelvic and perineal pain: Secondary | ICD-10-CM

## 2020-04-30 NOTE — Patient Instructions (Signed)
Pelvic Pain, Female Pelvic pain is pain in your lower abdomen, below your belly button and between your hips. The pain may start suddenly (be acute), keep coming back (be recurring), or last a long time (become chronic). Pelvic pain that lasts longer than 6 months is considered chronic. Pelvic pain may affect your:  Reproductive organs.  Urinary system.  Digestive tract.  Musculoskeletal system. There are many potential causes of pelvic pain. Sometimes, the pain can be a result of digestive or urinary conditions, strained muscles or ligaments, or reproductive conditions. Sometimes the cause of pelvic pain is not known. Follow these instructions at home:   Take over-the-counter and prescription medicines only as told by your health care provider.  Rest as told by your health care provider.  Do not have sex if it hurts.  Keep a journal of your pelvic pain. Write down: ? When the pain started. ? Where the pain is located. ? What seems to make the pain better or worse, such as food or your period (menstrual cycle). ? Any symptoms you have along with the pain.  Keep all follow-up visits as told by your health care provider. This is important. Contact a health care provider if:  Medicine does not help your pain.  Your pain comes back.  You have new symptoms.  You have abnormal vaginal discharge or bleeding, including bleeding after menopause.  You have a fever or chills.  You are constipated.  You have blood in your urine or stool.  You have foul-smelling urine.  You feel weak or light-headed. Get help right away if:  You have sudden severe pain.  Your pain gets steadily worse.  You have severe pain along with fever, nausea, vomiting, or excessive sweating.  You lose consciousness. Summary  Pelvic pain is pain in your lower abdomen, below your belly button and between your hips.  There are many potential causes of pelvic pain.  Keep a journal of your pelvic  pain. This information is not intended to replace advice given to you by your health care provider. Make sure you discuss any questions you have with your health care provider. Document Revised: 11/24/2017 Document Reviewed: 11/24/2017 Elsevier Patient Education  2020 [employer]  

## 2020-04-30 NOTE — Progress Notes (Signed)
Pt present due to having cyst on cervix. Pt stated that she noticed the pain about 2 months ago. Pt stated that the pain/tenderness is on the right side more than the left.

## 2020-04-30 NOTE — Progress Notes (Signed)
GYNECOLOGY PROGRESS NOTE  Subjective:    Patient ID: Allison Hudson, female    DOB: 03/31/1971, 49 y.o.   MRN: [id number]  HPI  Patient is a 49 y.o. [id number] female with a history of right ovarian cyst pain. She was previously seen by Harlow Mares, CNM, for similar complaint last year. Has had these intermittent bouts of pain for several years. Noticing more over the past 2 months due to pain with intercourse. During intercourse pain is sharp, intense.  After intercourse, dulls down for a few days and then resolves.  Also notes recent exam by PCP 3 weeks ago who noted a "mass on her cervix" that was the size of a marble.    GYN History:  No LMP recorded. Patient has had a hysterectomy.  Last pap smear: 04/12/2020.  Results were normal. No prior h/o abnormal pap smears.  Last mammogram: scheduled.  Last colonoscopy: scheduled.    Past Medical History:  Diagnosis Date  . Depression   . Frequent headaches     Family History  Problem Relation Age of Onset  . Hypertension Mother   . Hyperlipidemia Mother     Past Surgical History:  Procedure Laterality Date  . ABDOMINAL HYSTERECTOMY      Family History  Problem Relation Age of Onset  . Hypertension Mother   . Hyperlipidemia Mother     Social History   Socioeconomic History  . Marital status: Single    Spouse name: Not on file  . Number of children: Not on file  . Years of education: Not on file  . Highest education level: Not on file  Occupational History  . Not on file  Tobacco Use  . Smoking status: Never Smoker  . Smokeless tobacco: Never Used  Vaping Use  . Vaping Use: Never used  Substance and Sexual Activity  . Alcohol use: Yes    Alcohol/week: 3.0 standard drinks    Types: 3 Standard drinks or equivalent per week    Comment: occass  . Drug use: No  . Sexual activity: Yes    Partners: Male    Birth control/protection: Surgical    Comment: hyster  Other Topics Concern  . Not on file  Social  History Narrative  . Not on file   Social Determinants of Health   Financial Resource Strain:   . Difficulty of Paying Living Expenses: Not on file  Food Insecurity:   . Worried About Programme researcher, broadcasting/film/video in the Last Year: Not on file  . Ran Out of Food in the Last Year: Not on file  Transportation Needs:   . Lack of Transportation (Medical): Not on file  . Lack of Transportation (Non-Medical): Not on file  Physical Activity:   . Days of Exercise per Week: Not on file  . Minutes of Exercise per Session: Not on file  Stress:   . Feeling of Stress : Not on file  Social Connections:   . Frequency of Communication with Friends and Family: Not on file  . Frequency of Social Gatherings with Friends and Family: Not on file  . Attends Religious Services: Not on file  . Active Member of Clubs or Organizations: Not on file  . Attends Banker Meetings: Not on file  . Marital Status: Not on file  Intimate Partner Violence:   . Fear of Current or Ex-Partner: Not on file  . Emotionally Abused: Not on file  . Physically Abused: Not on file  . Sexually  Abused: Not on file    No current outpatient medications on file prior to visit.   No current facility-administered medications on file prior to visit.    No Known Allergies   Review of Systems Pertinent items noted in HPI and remainder of comprehensive ROS otherwise negative.   Objective:   Blood pressure 117/77, pulse 98, height 5' 1.5" (1.562 m), weight 129 lb 9.6 oz (58.8 kg). General appearance: alert and no distress Abdomen: normal findings: bowel sounds normal, no masses palpable and soft and abnormal findings:  mild tenderness in the RLQ Pelvic: external genitalia normal, rectovaginal septum normal.  Vagina without discharge.  Cervix with stenotic os. Nabothian cyst located at 9-10 o'clock, ~ size of a pea. No other lesions and no motion tenderness.  Uterus mobile, nontender, normal shape and size.  Adnexae  non-palpable, nontender bilaterally.  Extremities: extremities normal, atraumatic, no cyanosis or edema Neurologic: Grossly normal   Assessment:   1. Acute pelvic pain, female   2. Dyspareunia due to medical condition in female   3. Nabothian cyst    Plan:   1. Patient reports h/o ovarian cyst (right-sided) in the past, feeling similar pain again. Last ultrasound was several years ago (at least 6).  Will order ultrasound to assess pelvis for masses or other causes of pain.  2 Nabothian cyst benign, patient desires removal. Cyst punctured with mucoid discharge noted.   Will arrange follow up and further management based on ultrasound findings.    Hildred Laser, MD [facility]

## 2020-05-01 ENCOUNTER — Encounter: Payer: Self-pay | Admitting: Obstetrics and Gynecology

## 2020-05-02 ENCOUNTER — Other Ambulatory Visit
Admission: RE | Admit: 2020-05-02 | Discharge: 2020-05-02 | Disposition: A | Discharge status: Home / Self Care | Payer: BC Managed Care – PPO | Source: Ambulatory Visit | Attending: Gastroenterology | Admitting: Gastroenterology

## 2020-05-02 ENCOUNTER — Other Ambulatory Visit: Payer: Self-pay

## 2020-05-02 DIAGNOSIS — Z20822 Contact with and (suspected) exposure to covid-19: Secondary | ICD-10-CM | POA: Insufficient documentation

## 2020-05-02 DIAGNOSIS — Z01812 Encounter for preprocedural laboratory examination: Secondary | ICD-10-CM | POA: Diagnosis present

## 2020-05-03 LAB — SARS CORONAVIRUS 2 (TAT 6-24 HRS): SARS Coronavirus 2: NEGATIVE

## 2020-05-06 ENCOUNTER — Ambulatory Visit: Payer: BC Managed Care – PPO | Admitting: Registered Nurse

## 2020-05-06 ENCOUNTER — Ambulatory Visit
Admission: RE | Admit: 2020-05-06 | Discharge: 2020-05-06 | Disposition: A | Discharge status: Home / Self Care | Payer: BC Managed Care – PPO | Attending: Gastroenterology | Admitting: Gastroenterology

## 2020-05-06 ENCOUNTER — Encounter
Admission: RE | Disposition: A | Discharge status: Home / Self Care | Payer: Self-pay | Source: Home / Self Care | Attending: Gastroenterology

## 2020-05-06 ENCOUNTER — Encounter: Payer: Self-pay | Admitting: Gastroenterology

## 2020-05-06 DIAGNOSIS — Z1211 Encounter for screening for malignant neoplasm of colon: Secondary | ICD-10-CM | POA: Insufficient documentation

## 2020-05-06 DIAGNOSIS — Z8249 Family history of ischemic heart disease and other diseases of the circulatory system: Secondary | ICD-10-CM | POA: Diagnosis not present

## 2020-05-06 DIAGNOSIS — Z90710 Acquired absence of both cervix and uterus: Secondary | ICD-10-CM | POA: Diagnosis not present

## 2020-05-06 DIAGNOSIS — Z8349 Family history of other endocrine, nutritional and metabolic diseases: Secondary | ICD-10-CM | POA: Insufficient documentation

## 2020-05-06 HISTORY — PX: COLONOSCOPY WITH PROPOFOL: SHX5780

## 2020-05-06 SURGERY — COLONOSCOPY WITH PROPOFOL
Anesthesia: General

## 2020-05-06 MED ORDER — LIDOCAINE HCL (CARDIAC) PF 100 MG/5ML IV SOSY
PREFILLED_SYRINGE | INTRAVENOUS | Status: DC | PRN
  Administered 2020-05-06: 40 mg via INTRAVENOUS

## 2020-05-06 MED ORDER — PROPOFOL 500 MG/50ML IV EMUL
INTRAVENOUS | Status: DC | PRN
  Administered 2020-05-06: 175 ug/kg/min via INTRAVENOUS

## 2020-05-06 MED ORDER — PROPOFOL 10 MG/ML IV BOLUS
INTRAVENOUS | Status: DC | PRN
  Administered 2020-05-06: 90 mg via INTRAVENOUS

## 2020-05-06 MED ORDER — PROPOFOL 500 MG/50ML IV EMUL
INTRAVENOUS | Status: AC
  Filled 2020-05-06: qty 50

## 2020-05-06 MED ORDER — SODIUM CHLORIDE 0.9 % IV SOLN
INTRAVENOUS | Status: DC
  Administered 2020-05-06: 1000 mL via INTRAVENOUS

## 2020-05-06 MED ORDER — LIDOCAINE HCL (PF) 1 % IJ SOLN
INTRAMUSCULAR | Status: AC
  Filled 2020-05-06: qty 2

## 2020-05-06 MED ORDER — LIDOCAINE HCL (PF) 2 % IJ SOLN
INTRAMUSCULAR | Status: AC
  Filled 2020-05-06: qty 5

## 2020-05-06 NOTE — Anesthesia Procedure Notes (Signed)
Date/Time: 05/06/2020 11:00 AM Performed by: Stormy Fabian, CRNA Pre-anesthesia Checklist: Patient identified, Emergency Drugs available, Suction available and Patient being monitored Patient Re-evaluated:Patient Re-evaluated prior to induction Oxygen Delivery Method: Nasal cannula Induction Type: IV induction Dental Injury: Teeth and Oropharynx as per pre-operative assessment  Comments: Nasal cannula with etCO2 monitoring

## 2020-05-06 NOTE — Anesthesia Preprocedure Evaluation (Addendum)
Anesthesia Evaluation  Patient identified by MRN, date of birth, ID band Patient awake    Reviewed: Allergy & Precautions, H&P , NPO status , Patient's Chart, lab work & pertinent test results  History of Anesthesia Complications Negative for: history of anesthetic complications  Airway Mallampati: II  TM Distance: >3 FB Neck ROM: full    Dental  (+) Teeth Intact   Pulmonary neg pulmonary ROS, neg sleep apnea, neg COPD,    breath sounds clear to auscultation       Cardiovascular (-) angina(-) Past MI and (-) Cardiac Stents negative cardio ROS  (-) dysrhythmias  Rhythm:regular Rate:Normal     Neuro/Psych  Headaches, PSYCHIATRIC DISORDERS Anxiety Depression    GI/Hepatic negative GI ROS, Neg liver ROS,   Endo/Other  negative endocrine ROS  Renal/GU negative Renal ROS  negative genitourinary   Musculoskeletal   Abdominal   Peds  Hematology negative hematology ROS (+)   Anesthesia Other Findings Past Medical History: No date: Depression No date: Frequent headaches  Past Surgical History: No date: ABDOMINAL HYSTERECTOMY  BMI    Body Mass Index: 21.24 kg/m      Reproductive/Obstetrics negative OB ROS                            Anesthesia Physical Anesthesia Plan  ASA: I  Anesthesia Plan: General   Post-op Pain Management:    Induction:   PONV Risk Score and Plan: Propofol infusion and TIVA  Airway Management Planned: Nasal Cannula  Additional Equipment:   Intra-op Plan:   Post-operative Plan:   Informed Consent: I have reviewed the patients History and Physical, chart, labs and discussed the procedure including the risks, benefits and alternatives for the proposed anesthesia with the patient or authorized representative who has indicated his/her understanding and acceptance.     Dental Advisory Given  Plan Discussed with: Anesthesiologist, CRNA and  Surgeon  Anesthesia Plan Comments:         Anesthesia Quick Evaluation

## 2020-05-06 NOTE — Op Note (Signed)
[facility] Gastroenterology Patient Name: Allison Hudson Procedure Date: 05/06/2020 10:29 AM MRN: [id number] Account #: [id number] Date of Birth: 05-15-1971 Admit Type: Outpatient Age: 49 Room: [facility] ENDO ROOM 4 Gender: Female Note Status: Finalized Procedure:             Colonoscopy Indications:           Screening for colorectal malignant neoplasm Providers:             Wyline Mood MD, MD Referring MD:          [name] (Referring MD) Medicines:             Monitored Anesthesia Care Complications:         No immediate complications. Procedure:             Pre-Anesthesia Assessment:                        - Prior to the procedure, a History and Physical was                         performed, and patient medications, allergies and                         sensitivities were reviewed. The patient's tolerance                         of previous anesthesia was reviewed.                        - The risks and benefits of the procedure and the                         sedation options and risks were discussed with the                         patient. All questions were answered and informed                         consent was obtained.                        - ASA Grade Assessment: II - A patient with mild                         systemic disease.                        After obtaining informed consent, the colonoscope was                         passed under direct vision. Throughout the procedure,                         the patient's blood pressure, pulse, and oxygen                         saturations were monitored continuously. The                         Colonoscope was introduced through the anus and  advanced to the the cecum, identified by the                         appendiceal orifice. The colonoscopy was performed                         with ease. The patient tolerated the procedure well.                         The quality  of the bowel preparation was excellent. Findings:      The perianal and digital rectal examinations were normal.      The entire examined colon appeared normal on direct and retroflexion       views. Impression:            - The entire examined colon is normal on direct and                         retroflexion views.                        - No specimens collected. Recommendation:        - Discharge patient to home (with escort).                        - Resume previous diet.                        - Continue present medications.                        - Repeat colonoscopy in 10 years for screening                         purposes. Procedure Code(s):     --- Professional ---                        [phone], Colonoscopy, flexible; diagnostic, including                         collection of specimen(s) by brushing or washing, when                         performed (separate procedure) Diagnosis Code(s):     --- Professional ---                        Z12.11, Encounter for screening for malignant neoplasm                         of colon CPT copyright 2019 American Medical Association. All rights reserved. The codes documented in this report are preliminary and upon coder review may  be revised to meet current compliance requirements. Wyline Mood, MD Wyline Mood MD, MD 05/06/2020 11:18:28 AM This report has been signed electronically. Number of Addenda: 0 Note Initiated On: 05/06/2020 10:29 AM Scope Withdrawal Time: 0 hours 8 minutes 12 seconds  Total Procedure Duration: 0 hours 12 minutes 47 seconds  Estimated Blood Loss:  Estimated blood loss: none.      [facility]

## 2020-05-06 NOTE — H&P (Signed)
Wyline Mood, MD [address], [place], [place], [address] [address], [address], [place], [place], [address] Phone: [phone]  Fax: [phone]  Primary Care Physician:  Glori Luis, MD   Pre-Procedure History & Physical: HPI:  Allison Hudson is a 49 y.o. female is here for an colonoscopy.   Past Medical History:  Diagnosis Date  . Depression   . Frequent headaches     Past Surgical History:  Procedure Laterality Date  . ABDOMINAL HYSTERECTOMY      Prior to Admission medications   Not on File    Allergies as of 04/22/2020  . (No Known Allergies)    Family History  Problem Relation Age of Onset  . Hypertension Mother   . Hyperlipidemia Mother     Social History   Socioeconomic History  . Marital status: Single    Spouse name: Not on file  . Number of children: Not on file  . Years of education: Not on file  . Highest education level: Not on file  Occupational History  . Not on file  Tobacco Use  . Smoking status: Never Smoker  . Smokeless tobacco: Never Used  Vaping Use  . Vaping Use: Never used  Substance and Sexual Activity  . Alcohol use: Yes    Alcohol/week: 3.0 standard drinks    Types: 3 Standard drinks or equivalent per week    Comment: occass  . Drug use: No  . Sexual activity: Yes    Partners: Male    Birth control/protection: Surgical    Comment: hyster  Other Topics Concern  . Not on file  Social History Narrative  . Not on file   Social Determinants of Health   Financial Resource Strain:   . Difficulty of Paying Living Expenses: Not on file  Food Insecurity:   . Worried About Programme researcher, broadcasting/film/video in the Last Year: Not on file  . Ran Out of Food in the Last Year: Not on file  Transportation Needs:   . Lack of Transportation (Medical): Not on file  . Lack of Transportation (Non-Medical): Not on file  Physical Activity:   . Days of Exercise per Week: Not on file  . Minutes of Exercise per Session: Not  on file  Stress:   . Feeling of Stress : Not on file  Social Connections:   . Frequency of Communication with Friends and Family: Not on file  . Frequency of Social Gatherings with Friends and Family: Not on file  . Attends Religious Services: Not on file  . Active Member of Clubs or Organizations: Not on file  . Attends Banker Meetings: Not on file  . Marital Status: Not on file  Intimate Partner Violence:   . Fear of Current or Ex-Partner: Not on file  . Emotionally Abused: Not on file  . Physically Abused: Not on file  . Sexually Abused: Not on file    Review of Systems: See HPI, otherwise negative ROS  Physical Exam: BP 109/77   Pulse 74   Temp (!) 97.4 F (36.3 C) (Temporal)   Resp 17   Ht 5' 5.5" (1.664 m)   Wt 58.8 kg   SpO2 100%   BMI 21.24 kg/m  General:   Alert,  pleasant and cooperative in NAD Head:  Normocephalic and atraumatic. Neck:  Supple; no masses or thyromegaly. Lungs:  Clear throughout to auscultation, normal respiratory effort.    Heart:  +S1, +S2, Regular rate  and rhythm, No edema. Abdomen:  Soft, nontender and nondistended. Normal bowel sounds, without guarding, and without rebound.   Neurologic:  Alert and  oriented x4;  grossly normal neurologically.  Impression/Plan: Allison Hudson is here for an colonoscopy to be performed for Screening colonoscopy average risk   Risks, benefits, limitations, and alternatives regarding  colonoscopy have been reviewed with the patient.  Questions have been answered.  All parties agreeable.   Wyline Mood, MD  05/06/2020, 10:22 AM

## 2020-05-06 NOTE — Anesthesia Postprocedure Evaluation (Signed)
Anesthesia Post Note  Patient: Allison Hudson  Procedure(s) Performed: COLONOSCOPY WITH PROPOFOL (N/A )  Patient location during evaluation: PACU Anesthesia Type: General Level of consciousness: awake and alert Pain management: pain level controlled Vital Signs Assessment: post-procedure vital signs reviewed and stable Respiratory status: spontaneous breathing, nonlabored ventilation and respiratory function stable Cardiovascular status: blood pressure returned to baseline and stable Postop Assessment: no apparent nausea or vomiting Anesthetic complications: no   No complications documented.   Last Vitals:  Vitals:   05/06/20 1119 05/06/20 1139  BP: (!) 94/59 (!) 111/55  Pulse: 61   Resp: 14   Temp: (!) 35.9 C   SpO2: 100%     Last Pain:  Vitals:   05/06/20 1139  TempSrc:   PainSc: 0-No pain                 Aurelio Brash Jazmyn Offner

## 2020-05-06 NOTE — Transfer of Care (Signed)
Immediate Anesthesia Transfer of Care Note  Patient: Allison Hudson  Procedure(s) Performed: Procedure(s): COLONOSCOPY WITH PROPOFOL (N/A)  Patient Location: PACU and Endoscopy Unit  Anesthesia Type:General  Level of Consciousness: sedated  Airway & Oxygen Therapy: Patient Spontanous Breathing and Patient connected to nasal cannula oxygen  Post-op Assessment: Report given to RN and Post -op Vital signs reviewed and stable  Post vital signs: Reviewed and stable  Last Vitals:  Vitals:   05/06/20 0939 05/06/20 1119  BP: 109/77 (!) 94/59  Pulse: 74 61  Resp: 17 14  Temp: (!) 36.3 C (!) 35.9 C  SpO2: 100% 100%    Complications: No apparent anesthesia complications

## 2020-05-23 ENCOUNTER — Encounter: Payer: Self-pay | Admitting: Family Medicine

## 2020-11-25 ENCOUNTER — Telehealth: Payer: Self-pay | Admitting: Family Medicine

## 2020-11-25 ENCOUNTER — Ambulatory Visit
Admission: EM | Admit: 2020-11-25 | Discharge: 2020-11-25 | Disposition: A | Discharge status: Home / Self Care | Payer: BC Managed Care – PPO | Attending: Emergency Medicine | Admitting: Emergency Medicine

## 2020-11-25 ENCOUNTER — Encounter: Payer: Self-pay | Admitting: Emergency Medicine

## 2020-11-25 DIAGNOSIS — L03213 Periorbital cellulitis: Secondary | ICD-10-CM | POA: Diagnosis not present

## 2020-11-25 DIAGNOSIS — H00014 Hordeolum externum left upper eyelid: Secondary | ICD-10-CM

## 2020-11-25 MED ORDER — AMOXICILLIN-POT CLAVULANATE 875-125 MG PO TABS: 1.0000 | ORAL_TABLET | Freq: Two times a day (BID) | ORAL | 0 refills | Status: DC

## 2020-11-25 NOTE — Telephone Encounter (Signed)
Patient called and said she has a stye next to her eye for 8 days and will not go way. No appointments available. Patient was transferred to [facility] at [facility].

## 2020-11-25 NOTE — ED Triage Notes (Signed)
Pt presents today with c/o of pain/redness/swelling to left upper eyelid x 8 days.

## 2020-11-25 NOTE — Discharge Instructions (Signed)
Take the antibiotic as directed.    Schedule an appointment with an ophthalmologist such as the one listed below.  Go to the emergency department if you have acute eye pain, changes in your vision, or other concerning symptoms.

## 2020-11-25 NOTE — ED Provider Notes (Signed)
Renaldo Fiddler    CSN: [id number] Arrival date & time: 11/25/20  1058      History   Chief Complaint Chief Complaint  Patient presents with  . Eye Pain    Left upper    HPI Allison Hudson is a 50 y.o. female.   Patient presents with redness and swelling of her left upper eyelid for 8 days.  No falls or injury.  Her eyelid is tender but no acute eye pain.  Her vision in her left eye is blurry.  No drainage.  No fever or chills.  No treatments attempted at home.  Her medical history includes frequent headaches, anxiety, depression.  The history is provided by the patient and medical records.    Past Medical History:  Diagnosis Date  . Depression   . Frequent headaches     Patient Active Problem List   Diagnosis Date Noted  . Encounter for general adult medical examination with abnormal findings 04/12/2020  . Dyspareunia in female 04/12/2020  . Cough 01/15/2020  . Fatigue 08/02/2018  . Elevated BP without diagnosis of hypertension 08/02/2018  . Pelvic pain 08/30/2017  . Encounter for monitoring testosterone replacement therapy 03/02/2017  . Anxiety and depression 07/02/2016  . Insomnia 07/02/2016    Past Surgical History:  Procedure Laterality Date  . ABDOMINAL HYSTERECTOMY    . COLONOSCOPY WITH PROPOFOL N/A 05/06/2020   Procedure: COLONOSCOPY WITH PROPOFOL;  Surgeon: Wyline Mood, MD;  Location: [facility] ENDOSCOPY;  Service: Gastroenterology;  Laterality: N/A;    OB History    Gravida  5   Para  4   Term  3   Preterm  1   AB  1   Living  4     SAB  1   IAB      Ectopic      Multiple      Live Births  4            Home Medications    Prior to Admission medications   Medication Sig Start Date End Date Taking? Authorizing Provider  amoxicillin-clavulanate (AUGMENTIN) 875-125 MG tablet Take 1 tablet by mouth every 12 (twelve) hours. 11/25/20  Yes Mickie Bail, NP    Family History Family History  Problem Relation Age of Onset  .  Hypertension Mother   . Hyperlipidemia Mother     Social History Social History   Tobacco Use  . Smoking status: Never Smoker  . Smokeless tobacco: Never Used  Vaping Use  . Vaping Use: Never used  Substance Use Topics  . Alcohol use: Yes    Alcohol/week: 3.0 standard drinks    Types: 3 Standard drinks or equivalent per week    Comment: occass  . Drug use: No     Allergies   Patient has no known allergies.   Review of Systems Review of Systems  Constitutional: Negative for chills and fever.  Eyes: Positive for photophobia, redness and visual disturbance. Negative for pain, discharge and itching.  Respiratory: Negative for cough and shortness of breath.   Cardiovascular: Negative for chest pain and palpitations.  Gastrointestinal: Negative for abdominal pain and vomiting.  Skin: Negative for color change and rash.  All other systems reviewed and are negative.    Physical Exam Triage Vital Signs ED Triage Vitals  Enc Vitals Group     BP 11/25/20 1111 99/73     Pulse Rate 11/25/20 1111 65     Resp 11/25/20 1111 18  Temp 11/25/20 1111 98.2 F (36.8 C)     Temp Source 11/25/20 1111 Oral     SpO2 11/25/20 1111 99 %     Weight --      Height --      Head Circumference --      Peak Flow --      Pain Score 11/25/20 1109 5     Pain Loc --      Pain Edu? --      Excl. in GC? --    No data found.  Updated Vital Signs BP 99/73 (BP Location: Left Arm)   Pulse 65   Temp 98.2 F (36.8 C) (Oral)   Resp 18   SpO2 99%   Visual Acuity Right Eye Distance: 20/25 Left Eye Distance: 20/60 Bilateral Distance: 20/16  Right Eye Near:   Left Eye Near:    Bilateral Near:     Physical Exam Vitals and nursing note reviewed.  Constitutional:      General: She is not in acute distress.    Appearance: She is well-developed.  HENT:     Head: Normocephalic and atraumatic.  Eyes:     Extraocular Movements: Extraocular movements intact.     Conjunctiva/sclera:  Conjunctivae normal.     Pupils: Pupils are equal, round, and reactive to light.     Comments: Left upper eyelid erythematous with mild edema and pustule.  No drainage.  See picture for details.  Cardiovascular:     Rate and Rhythm: Normal rate and regular rhythm.     Heart sounds: Normal heart sounds.  Pulmonary:     Effort: Pulmonary effort is normal. No respiratory distress.     Breath sounds: Normal breath sounds.  Abdominal:     Palpations: Abdomen is soft.     Tenderness: There is no abdominal tenderness.  Musculoskeletal:     Cervical back: Neck supple.  Skin:    General: Skin is warm and dry.  Neurological:     General: No focal deficit present.     Mental Status: She is alert and oriented to person, place, and time.     Gait: Gait normal.  Psychiatric:        Mood and Affect: Mood normal.        Behavior: Behavior normal.        UC Treatments / Results  Labs (all labs ordered are listed, but only abnormal results are displayed) Labs Reviewed - No data to display  EKG   Radiology No results found.  Procedures Procedures (including critical care time)  Medications Ordered in UC Medications - No data to display  Initial Impression / Assessment and Plan / UC Course  I have reviewed the triage vital signs and the nursing notes.  Pertinent labs & imaging results that were available during my care of the patient were reviewed by me and considered in my medical decision making (see chart for details).   Left upper eyelid cellulitis and stye.  Treating with Augmentin.  Instructed patient to follow-up with an ophthalmologist; contact information for Dr. Druscilla Brownie provided as he is on-call for unassigned today.  ED precautions discussed with patient.  She agrees to plan of care.   Final Clinical Impressions(s) / UC Diagnoses   Final diagnoses:  Preseptal cellulitis of left upper eyelid  Hordeolum externum of left upper eyelid     Discharge Instructions      Take the antibiotic as directed.    Schedule an appointment with an ophthalmologist  such as the one listed below.  Go to the emergency department if you have acute eye pain, changes in your vision, or other concerning symptoms.       ED Prescriptions    Medication Sig Dispense Auth. Provider   amoxicillin-clavulanate (AUGMENTIN) 875-125 MG tablet Take 1 tablet by mouth every 12 (twelve) hours. 14 tablet Mickie Bail, NP     PDMP not reviewed this encounter.   Mickie Bail, NP 11/25/20 1144

## 2020-11-26 NOTE — Telephone Encounter (Signed)
Pt called in complaining of a STY in her left eye. Patient was seen in the ED on 11/25/20 for eye pain.

## 2020-11-26 NOTE — Telephone Encounter (Signed)
Noted  

## 2020-11-26 NOTE — Telephone Encounter (Signed)
I called the patient and she stated she had already taken care of her eye issue.  Arafat Cocuzza,cma

## 2021-04-14 ENCOUNTER — Encounter: Payer: BC Managed Care – PPO | Admitting: Family Medicine

## 2021-05-26 ENCOUNTER — Encounter: Payer: Self-pay | Admitting: Family Medicine

## 2021-05-26 ENCOUNTER — Ambulatory Visit (INDEPENDENT_AMBULATORY_CARE_PROVIDER_SITE_OTHER): Payer: Self-pay | Admitting: Family Medicine

## 2021-05-26 ENCOUNTER — Other Ambulatory Visit: Payer: Self-pay

## 2021-05-26 VITALS — BP 120/80 | HR 78 | Temp 97.9°F | Ht 61.0 in | Wt 139.8 lb

## 2021-05-26 DIAGNOSIS — E663 Overweight: Secondary | ICD-10-CM

## 2021-05-26 DIAGNOSIS — Z13220 Encounter for screening for lipoid disorders: Secondary | ICD-10-CM

## 2021-05-26 DIAGNOSIS — Z0001 Encounter for general adult medical examination with abnormal findings: Secondary | ICD-10-CM

## 2021-05-26 DIAGNOSIS — Z1231 Encounter for screening mammogram for malignant neoplasm of breast: Secondary | ICD-10-CM

## 2021-05-26 DIAGNOSIS — L659 Nonscarring hair loss, unspecified: Secondary | ICD-10-CM

## 2021-05-26 NOTE — Assessment & Plan Note (Signed)
Check labs.  Discussed the potential for seeing dermatology if her lab work is unremarkable.

## 2021-05-26 NOTE — Patient Instructions (Signed)
Nice to see you. Please try to get back to your 5-6 meals per day of eating.  See if that helps with your weight. Please call [phone] to schedule your mammogram. We will get lab work today and contact you with the results.

## 2021-05-26 NOTE — Progress Notes (Signed)
Tommi Rumps, MD Phone: [phone]  Allison Hudson is a 50 y.o. female who presents today for CPE.  Diet: Generally healthy, eats 4-5 meals a day, salads, protein bars, protein shakes, lean meats, not many carbs, no junk food Exercise: Goes to the gym 3 days a week with weights and cardio, does hot yoga 4-5 times a week Pap smear: 04/12/2020 NILM neg HPV Colonoscopy: 05/06/2020 with 10-year recall Mammogram: Due Family history-  Colon cancer: no  Breast cancer: no  Ovarian cancer: no Menses: hysterectomy Vaccines-   Flu: declines  Tetanus: in the past 10 years  COVID19: declines HIV screening: declines Hep C Screening: declines Tobacco use: no Alcohol use: occasional Illicit Drug use: no Dentist: yes Ophthalmology: yes Does report some hair loss that has been going on for about 6 weeks.  Does not seem to be related to her hair dye   Active Ambulatory Problems    Diagnosis Date Noted   Anxiety and depression 07/02/2016   Insomnia 07/02/2016   Encounter for monitoring testosterone replacement therapy 03/02/2017   Pelvic pain 08/30/2017   Fatigue 08/02/2018   Elevated BP without diagnosis of hypertension 08/02/2018   Cough 01/15/2020   Encounter for general adult medical examination with abnormal findings 04/12/2020   Dyspareunia in female 04/12/2020   Hair loss 05/26/2021   Resolved Ambulatory Problems    Diagnosis Date Noted   Influenza-like illness 07/02/2016   Past Medical History:  Diagnosis Date   Depression    Frequent headaches     Family History  Problem Relation Age of Onset   Hypertension Mother    Hyperlipidemia Mother     Social History   Socioeconomic History   Marital status: Single    Spouse name: Not on file   Number of children: Not on file   Years of education: Not on file   Highest education level: Not on file  Occupational History   Not on file  Tobacco Use   Smoking status: Never   Smokeless tobacco: Never  Vaping Use    Vaping Use: Never used  Substance and Sexual Activity   Alcohol use: Yes    Alcohol/week: 3.0 standard drinks    Types: 3 Standard drinks or equivalent per week    Comment: occass   Drug use: No   Sexual activity: Yes    Partners: Male    Birth control/protection: Surgical    Comment: hyster  Other Topics Concern   Not on file  Social History Narrative   Not on file   Social Determinants of Health   Financial Resource Strain: Not on file  Food Insecurity: Not on file  Transportation Needs: Not on file  Physical Activity: Not on file  Stress: Not on file  Social Connections: Not on file  Intimate Partner Violence: Not on file    ROS  General:  Negative for nexplained weight loss, fever Skin: Negative for new or changing mole, sore that won't heal HEENT: Negative for trouble hearing, trouble seeing, ringing in ears, mouth sores, hoarseness, change in voice, dysphagia. CV:  Negative for chest pain, dyspnea, edema, palpitations Resp: Negative for cough, dyspnea, hemoptysis GI: Negative for nausea, vomiting, diarrhea, constipation, abdominal pain, melena, hematochezia. GU: Negative for dysuria, incontinence, urinary hesitance, hematuria, vaginal or penile discharge, polyuria, sexual difficulty, lumps in testicle or breasts MSK: Negative for muscle cramps or aches, joint pain or swelling Neuro: Negative for headaches, weakness, numbness, dizziness, passing out/fainting Psych: Negative for depression, anxiety, memory problems  Objective  Physical Exam Vitals:   05/26/21 1428  BP: 120/80  Pulse: 78  Temp: 97.9 F (36.6 C)  SpO2: 99%    BP Readings from Last 3 Encounters:  05/26/21 120/80  11/25/20 99/73  05/06/20 122/74   Wt Readings from Last 3 Encounters:  05/26/21 139 lb 12.8 oz (63.4 kg)  05/06/20 129 lb 9.4 oz (58.8 kg)  04/30/20 129 lb 9.6 oz (58.8 kg)    Physical Exam Constitutional:      General: She is not in acute distress.    Appearance: She is  not diaphoretic.  HENT:     Head: Normocephalic and atraumatic.  Eyes:     Conjunctiva/sclera: Conjunctivae normal.     Pupils: Pupils are equal, round, and reactive to light.  Cardiovascular:     Rate and Rhythm: Normal rate and regular rhythm.     Heart sounds: Normal heart sounds.  Pulmonary:     Effort: Pulmonary effort is normal.     Breath sounds: Normal breath sounds.  Abdominal:     General: Bowel sounds are normal. There is no distension.     Palpations: Abdomen is soft.     Tenderness: There is no abdominal tenderness. There is no guarding or rebound.  Musculoskeletal:     Right lower leg: No edema.     Left lower leg: No edema.  Lymphadenopathy:     Cervical: No cervical adenopathy.  Skin:    General: Skin is warm and dry.     Comments: Central frontal hair loss noted, no apparent scalp irritation  Neurological:     Mental Status: She is alert.  Psychiatric:        Mood and Affect: Mood normal.     Assessment/Plan:   Problem List Items Addressed This Visit     Encounter for general adult medical examination with abnormal findings - Primary    Physical exam completed.  I encouraged continued healthy diet and exercise.  She had better efficacy with weight loss when she was eating 6 meals per day.  She will get back to doing that.  She will call to schedule her mammogram.  Lab work as outlined.      Hair loss    Check labs.  Discussed the potential for seeing dermatology if her lab work is unremarkable.      Relevant Orders   TSH   Vitamin D (25 hydroxy)   Other Visit Diagnoses     Lipid screening       Relevant Orders   Comp Met (CMET)   Lipid panel   Overweight       Relevant Orders   HgB A1c   Encounter for screening mammogram for malignant neoplasm of breast       Relevant Orders   MM 3D SCREEN BREAST BILATERAL       Return in about 1 year (around 05/26/2022) for CPE.  This visit occurred during the SARS-CoV-2 public health emergency.   Safety protocols were in place, including screening questions prior to the visit, additional usage of staff PPE, and extensive cleaning of exam room while observing appropriate contact time as indicated for disinfecting solutions.    Tommi Rumps, MD [place]

## 2021-05-26 NOTE — Assessment & Plan Note (Signed)
Physical exam completed.  I encouraged continued healthy diet and exercise.  She had better efficacy with weight loss when she was eating 6 meals per day.  She will get back to doing that.  She will call to schedule her mammogram.  Lab work as outlined.

## 2021-05-27 LAB — VITAMIN D 25 HYDROXY (VIT D DEFICIENCY, FRACTURES): VITD: 36.17 ng/mL (ref 30.00–100.00)

## 2021-05-27 LAB — COMPREHENSIVE METABOLIC PANEL
ALT: 20 U/L (ref 0–35)
AST: 22 U/L (ref 0–37)
Albumin: 4.5 g/dL (ref 3.5–5.2)
Alkaline Phosphatase: 63 U/L (ref 39–117)
BUN: 20 mg/dL (ref 6–23)
CO2: 27 mEq/L (ref 19–32)
Calcium: 9.4 mg/dL (ref 8.4–10.5)
Chloride: 104 mEq/L (ref 96–112)
Creatinine, Ser: 0.73 mg/dL (ref 0.40–1.20)
GFR: 96.43 mL/min (ref >60.00)
Glucose, Bld: 77 mg/dL (ref 70–99)
Potassium: 4.1 mEq/L (ref 3.5–5.1)
Sodium: 140 mEq/L (ref 135–145)
Total Bilirubin: 0.3 mg/dL (ref 0.2–1.2)
Total Protein: 6.9 g/dL (ref 6.0–8.3)

## 2021-05-27 LAB — TSH: TSH: 0.93 u[IU]/mL (ref 0.35–5.50)

## 2021-05-27 LAB — LIPID PANEL
Cholesterol: 216 mg/dL — ABNORMAL HIGH (ref 0–200)
HDL: 76 mg/dL (ref >39.00)
LDL Cholesterol: 117 mg/dL — ABNORMAL HIGH (ref 0–99)
NonHDL: 140.33
Total CHOL/HDL Ratio: 3
Triglycerides: 115 mg/dL (ref 0.0–149.0)
VLDL: 23 mg/dL (ref 0.0–40.0)

## 2021-05-27 LAB — HEMOGLOBIN A1C: Hgb A1c MFr Bld: 5.3 % (ref 4.6–6.5)

## 2022-04-06 ENCOUNTER — Telehealth: Payer: Self-pay

## 2022-04-06 NOTE — Telephone Encounter (Signed)
Patient states she has been experiencing fatigue.  Patient states she has changed her diet after talking with Dr. Tommi Rumps, but she is still experiencing fatigue.  Patient states she would like to have the following tests when she comes to see Dr. Caryl Bis on 05/27/2022 for her physical:  CMP CBC Vitamin B12 Folate Estradiol Melrose LH T3, free T4, total TSH Vitamin D, 25-OH, total Progesterone Hemaglobin A1c Lipid panel Testosterone-free, and total with SHBG Thyroid peroxidase Ab  Patient would like to know which of these would be covered by insurance and how much the ones that are not covered will be.

## 2022-04-07 NOTE — Telephone Encounter (Signed)
I called and spoke with then patient and informed her the labs that the provider stated needed certain diagnoses and I informed her about the insurance coverage,  Patient stated she wanted theses labs drawn at her appointment on 05/27/2022 with the provider at her physical.  She stated she does not care about the insurance she stated she would pay out of pocket if necessary but she has a provider that needs theses particular labs done and she stated she wants all theses labs done due to her body building in the past.  Taahir Grisby,cma

## 2022-04-07 NOTE — Telephone Encounter (Signed)
Noted. We can discuss further at her appointment.

## 2022-04-07 NOTE — Telephone Encounter (Signed)
I am not sure which of those her insurance would cover for that diagnosis. The CMP, lipid panel, and A1c are typically covered for a physical, though I can not promise they would be covered depending on what diagnosis I have to use to code for them. I am not sure what utility the estradiol, FSH, LH, progesterone, or testosterone would serve as abnormalities in those are not typically treated in women. I would only order a thyroid peroxidase antibody, T4, and T3 if her TSH was abnormal. If she wants to determine if her insurance covers any of these she would need to contact her insurance to ask about coverage.

## 2022-04-08 ENCOUNTER — Telehealth: Payer: Self-pay | Admitting: Family Medicine

## 2022-04-08 NOTE — Telephone Encounter (Signed)
Pt called stating she would like to talk to the cma about another issue that she forgot to tell her

## 2022-04-08 NOTE — Telephone Encounter (Signed)
LVM for patient to call back.   Collyns Mcquigg,cma  

## 2022-04-09 NOTE — Telephone Encounter (Signed)
Pt returning call

## 2022-04-10 NOTE — Telephone Encounter (Signed)
Patient is scheduled.  Sahir Tolson,cma  

## 2022-04-10 NOTE — Telephone Encounter (Signed)
Patient states she is calling back to speak with Fulton Mole, CMA.  Patient states she does not wish to give any further detail.

## 2022-04-10 NOTE — Telephone Encounter (Signed)
Patient states she would like to see if Fulton Mole, CMA, is available regarding previous message.  I was unable to reach [place] at the time of the call.  I let patient know that I will send her a note.

## 2022-04-13 ENCOUNTER — Encounter: Payer: Self-pay | Admitting: Family Medicine

## 2022-04-13 ENCOUNTER — Ambulatory Visit (INDEPENDENT_AMBULATORY_CARE_PROVIDER_SITE_OTHER): Payer: Self-pay | Admitting: Family Medicine

## 2022-04-13 ENCOUNTER — Other Ambulatory Visit (HOSPITAL_COMMUNITY)
Admission: RE | Admit: 2022-04-13 | Discharge: 2022-04-13 | Disposition: A | Discharge status: Home / Self Care | Payer: BC Managed Care – PPO | Source: Ambulatory Visit | Attending: Family Medicine | Admitting: Family Medicine

## 2022-04-13 VITALS — BP 90/60 | HR 75 | Temp 97.8°F | Ht 61.0 in | Wt 126.4 lb

## 2022-04-13 DIAGNOSIS — N898 Other specified noninflammatory disorders of vagina: Secondary | ICD-10-CM | POA: Insufficient documentation

## 2022-04-13 DIAGNOSIS — N9089 Other specified noninflammatory disorders of vulva and perineum: Secondary | ICD-10-CM

## 2022-04-13 NOTE — Assessment & Plan Note (Signed)
Patient with irritation and itching of her external genitals though over the last 2 days this is gone away.  She has a generally benign external exam.  She did have some vaginal discharge which we swabbed and will test for yeast, BV, gonorrhea, and chlamydia, and trichomonas.  We will contact her with the results.  If her external symptoms return she will let us know.

## 2022-04-13 NOTE — Patient Instructions (Signed)
We will contact you with your swab results. If your symptoms return please let me know.

## 2022-04-13 NOTE — Progress Notes (Signed)
  Tommi Rumps, MD Phone: [phone]  Allison Hudson is a 51 y.o. female who presents today for same-day visit.  External genital itching: Patient notes this has been going on 2 weeks.  Has not been all that bad the last 2 days.  She is status post partial hysterectomy at age 66.  She notes no vaginal discharge.  No abdominal pain.  Has had some blood when wiping.  She not tried any medicines for this.  She has 1 sexual partner.  No history of STDs.  No concern for STDs.  Patient is status post partial hysterectomy.  Social History   Tobacco Use  Smoking Status Never  Smokeless Tobacco Never    No current outpatient medications on file prior to visit.   No current facility-administered medications on file prior to visit.     ROS see history of present illness  Objective  Physical Exam Vitals:   04/13/22 1339  BP: 90/60  Pulse: 75  Temp: 97.8 F (36.6 C)  SpO2: 98%    BP Readings from Last 3 Encounters:  04/13/22 90/60  05/26/21 120/80  11/25/20 99/73   Wt Readings from Last 3 Encounters:  04/13/22 126 lb 6.4 oz (57.3 kg)  05/26/21 139 lb 12.8 oz (63.4 kg)  05/06/20 129 lb 9.4 oz (58.8 kg)    Physical Exam Genitourinary:    Comments: Fulton Mole, CMA served as chaperone, patient had a 2 to 3 mm cut just above the left aspect of the clitoris hood, otherwise external genitals appear normal, she had milky white discharge internally, cervix appeared normal     Assessment/Plan: Please see individual problem list.  Problem List Items Addressed This Visit     Labia irritation - Primary    Patient with irritation and itching of her external genitals though over the last 2 days this is gone away.  She has a generally benign external exam.  She did have some vaginal discharge which we swabbed and will test for yeast, BV, gonorrhea, and chlamydia, and trichomonas.  We will contact her with the results.  If her external symptoms return she will let us know.       Other Visit Diagnoses     Vaginal discharge       Relevant Orders   Cervicovaginal ancillary only( [place])        Return for as scheduled.   Tommi Rumps, MD [place]

## 2022-04-14 ENCOUNTER — Other Ambulatory Visit: Payer: Self-pay | Admitting: Family Medicine

## 2022-04-14 LAB — CERVICOVAGINAL ANCILLARY ONLY
Bacterial Vaginitis (gardnerella): POSITIVE — AB
Candida Glabrata: NEGATIVE
Candida Vaginitis: NEGATIVE
Chlamydia: NEGATIVE
Comment: NEGATIVE
Comment: NEGATIVE
Comment: NEGATIVE
Comment: NEGATIVE
Comment: NEGATIVE
Comment: NORMAL
Neisseria Gonorrhea: NEGATIVE
Trichomonas: NEGATIVE

## 2022-04-14 MED ORDER — METRONIDAZOLE 500 MG PO TABS: 500.0000 mg | ORAL_TABLET | Freq: Two times a day (BID) | ORAL | 0 refills | Status: DC

## 2022-04-24 ENCOUNTER — Encounter: Payer: Self-pay | Admitting: Family Medicine

## 2022-04-24 ENCOUNTER — Ambulatory Visit (INDEPENDENT_AMBULATORY_CARE_PROVIDER_SITE_OTHER): Payer: BC Managed Care – PPO | Admitting: Family Medicine

## 2022-04-24 ENCOUNTER — Telehealth: Payer: Self-pay

## 2022-04-24 VITALS — BP 120/80 | HR 96 | Temp 98.7°F | Ht 61.0 in | Wt 125.4 lb

## 2022-04-24 DIAGNOSIS — R21 Rash and other nonspecific skin eruption: Secondary | ICD-10-CM | POA: Diagnosis not present

## 2022-04-24 DIAGNOSIS — N9089 Other specified noninflammatory disorders of vulva and perineum: Secondary | ICD-10-CM | POA: Diagnosis not present

## 2022-04-24 DIAGNOSIS — R350 Frequency of micturition: Secondary | ICD-10-CM

## 2022-04-24 LAB — POCT URINALYSIS DIPSTICK
Bilirubin, UA: NEGATIVE
Glucose, UA: NEGATIVE
Ketones, UA: NEGATIVE
Leukocytes, UA: NEGATIVE
Nitrite, UA: NEGATIVE
Protein, UA: NEGATIVE
Spec Grav, UA: 1.01 (ref 1.010–1.025)
Urobilinogen, UA: 0.2 E.U./dL
pH, UA: 6 (ref 5.0–8.0)

## 2022-04-24 MED ORDER — CLOBETASOL PROPIONATE 0.05 % EX OINT: 1.0000 | TOPICAL_OINTMENT | Freq: Two times a day (BID) | CUTANEOUS | 0 refills | Status: DC

## 2022-04-24 NOTE — Patient Instructions (Signed)
Nice to see you. Please use the clobetasol ointment on the rash on your genitals and near your rectum.  Please do not put this into your vagina and do not get this in your eyes.  Please follow the instructions for duration of use.  I have also referred you to dermatology.

## 2022-04-24 NOTE — Telephone Encounter (Signed)
Patient is coming in today before 4:30 pm per provider she was notified  Evalynn Hankins,cma

## 2022-04-24 NOTE — Telephone Encounter (Signed)
Patient was called and informed that the provider wants to see her before 4:30 pm and she stated she would come in.  Yameli Delamater,cma

## 2022-04-24 NOTE — Progress Notes (Unsigned)
  Tommi Rumps, MD Phone: [phone]  Allison Hudson is a 51 y.o. female who presents today for f/.   Genital rash: Patient notes this has continued to bother her.  It is gotten worse over the last couple of days.  She notes no improvement with the Flagyl that was used to treat a bacterial vaginosis infection.  She also reports some recent urine frequency.  Social History   Tobacco Use  Smoking Status Never  Smokeless Tobacco Never    Current Outpatient Medications on File Prior to Visit  Medication Sig Dispense Refill   metroNIDAZOLE (FLAGYL) 500 MG tablet Take 1 tablet (500 mg total) by mouth 2 (two) times daily. 14 tablet 0   No current facility-administered medications on file prior to visit.     ROS see history of present illness  Objective  Physical Exam Vitals:   04/24/22 1646  BP: 120/80  Pulse: 96  Temp: 98.7 F (37.1 C)  SpO2: 96%    BP Readings from Last 3 Encounters:  04/24/22 120/80  04/13/22 90/60  05/26/21 120/80   Wt Readings from Last 3 Encounters:  04/24/22 125 lb 6.4 oz (56.9 kg)  04/13/22 126 lb 6.4 oz (57.3 kg)  05/26/21 139 lb 12.8 oz (63.4 kg)    Physical Exam Genitourinary:      Comments: Fulton Mole, CMA served as chaperone, area inside the lines drawn above with apparent thinning of the skin with a pinkish underlying color throughout though certain spots have more whitish coloring, there are some skin tears in the area closer to her rectum     Assessment/Plan: Please see individual problem list.  Problem List Items Addressed This Visit     Labia irritation    Patient definitely has a rash today which is very different than her exam from about a week and a half ago.  Concerning for lichen sclerosis versus some other inflammatory skin issue.  I will treat with topical clobetasol twice daily for 14 days and then twice weekly after that.  We will refer to dermatology.  She was advised not to put the clobetasol inside of her  vagina.  Advised if the clobetasol is excessively expensive she is to let me know.  Discussed that lichen sclerosus is a chronic skin condition that is typically seen in postmenopausal women though can be seen in others.  Discussed that this would require lifelong treatment if it proves to be lichen sclerosus.      Urine frequency    Checking urine studies.  I would only treat if her urine culture is positive.      Relevant Orders   POCT Urinalysis Dipstick (Completed)   Urine Culture (Completed)   Urine Microscopic (Completed)   Other Visit Diagnoses     Rash of genital area    -  Primary   Relevant Medications   clobetasol ointment (TEMOVATE) 0.05 %   Other Relevant Orders   Ambulatory referral to Dermatology       Return if symptoms worsen or fail to improve.   Tommi Rumps, MD [place]

## 2022-04-24 NOTE — Telephone Encounter (Signed)
LVM for patient to call back.   Elfa Wooton,cma  

## 2022-04-24 NOTE — Telephone Encounter (Signed)
Message just viewed by me. If she can come in this afternoon I can see her as a work in so I can visualize the area, though if she can not come in this afternoon she could go to urgent care or the [facility] this evening or weekend or she could see me next week.

## 2022-04-24 NOTE — Telephone Encounter (Signed)
Pt called stating that she was here last week. Dr. Biagio Quint sent in an abx she stated she took the last one yesterday and she is still having the issue. The issue has gotten worst she is now itching from the top of her vagina back to her butt. Pt states she was triaged and they wanted her to be seen.   Pt really does not want to be seen again but she would like to be worked in today if could be around 11 or so because the issue has gotten worst.

## 2022-04-26 LAB — URINE CULTURE
MICRO NUMBER:: 14141614
SPECIMEN QUALITY:: ADEQUATE

## 2022-04-26 LAB — URINALYSIS, MICROSCOPIC ONLY
Bacteria, UA: NONE SEEN /HPF
Hyaline Cast: NONE SEEN /LPF
RBC / HPF: NONE SEEN /HPF (ref 0–2)
WBC, UA: NONE SEEN /HPF (ref 0–5)

## 2022-04-27 ENCOUNTER — Ambulatory Visit (INDEPENDENT_AMBULATORY_CARE_PROVIDER_SITE_OTHER): Payer: BC Managed Care – PPO | Admitting: Dermatology

## 2022-04-27 ENCOUNTER — Other Ambulatory Visit: Payer: Self-pay | Admitting: Family Medicine

## 2022-04-27 ENCOUNTER — Ambulatory Visit: Payer: BC Managed Care – PPO | Admitting: Family Medicine

## 2022-04-27 DIAGNOSIS — R350 Frequency of micturition: Secondary | ICD-10-CM | POA: Insufficient documentation

## 2022-04-27 DIAGNOSIS — L900 Lichen sclerosus et atrophicus: Secondary | ICD-10-CM | POA: Diagnosis not present

## 2022-04-27 DIAGNOSIS — R102 Pelvic and perineal pain: Secondary | ICD-10-CM

## 2022-04-27 DIAGNOSIS — R21 Rash and other nonspecific skin eruption: Secondary | ICD-10-CM

## 2022-04-27 MED ORDER — HYDROCORT-PRAMOXINE (PERIANAL) 2.5-1 % EX CREA: TOPICAL_CREAM | CUTANEOUS | 0 refills | Status: DC

## 2022-04-27 NOTE — Telephone Encounter (Signed)
Patient would like a call back to follow up on her visit from Friday.

## 2022-04-27 NOTE — Progress Notes (Signed)
   New Patient Visit  Subjective  Allison Hudson is a 51 y.o. female who presents for the following: Rash  ( Vagina - itchy, painful, bled. PCP prescribed Metronidazole but rash worsened so she seen him again. PCP thinks condition may be LS et A and prescribed her Clobetasol 0.05% ointment BID x 2 wks then 2d/wk which has helped. She also c/o R sided pain and inflammation that radiates down her leg for which she is seeing GYN). No new lotions, medications, soaps, detergents to area per patient. Symptoms have improved a lot since started clobetasol.   The following portions of the chart were reviewed this encounter and updated as appropriate:       Review of Systems:  No other skin or systemic complaints except as noted in HPI or Assessment and Plan.  Objective  Well appearing patient in no apparent distress; mood and affect are within normal limits.  A focused examination was performed including the vagina. Relevant physical exam findings are noted in the Assessment and Plan.  vagina Shiny mild erythema of the labia minora folds. No open sores or purpura. no evidence of candida or discharge  Lower abdomen Pink patch no scale.     Assessment & Plan  Lichen sclerosus et atrophicus vagina  Probable LS et A - improving with Clobetasol ointment, Chronic and persistent condition with duration or expected duration over one year. Condition is symptomatic/ bothersome to patient. Not currently at goal.   Lichen sclerosus is a chronic inflammatory condition of unknown cause that frequently involves the vaginal area and less commonly extragenital skin, and is NOT sexually transmitted. It frequently causes symptoms of pain and burning.  It requires regular monitoring and treatment with topical steroids to minimize inflammation and to reduce risk of scarring. There is also a risk of cancer in the vaginal area which is very low if inflammation is well controlled. Regular checks of the area are  recommended. Please call if you notice any new or changing spots within this area.  Continue Clobetasol 0.05% ointment to aa's BID up to two weeks then 2d/wk thereafter. Pt has. Apply clobetasol at frequency needed to stay symptom free. Avoid cutaneous skin/body folds- discussed risk of skin thinning in those areas.  Do not use clobetasol to perianal area- Caution skin atrophy with long-term use.  Start Analpram-HC to aa's BID PRN itch to the rectal area.   hydrocortisone-pramoxine (ANALPRAM-HC) 2.5-1 % rectal cream - vagina Apply to rash around rectal area BID PRN.  Rash and other nonspecific skin eruption Lower abdomen  Possible Irritant contact dermatitis   Start Clobetasol 0.05% ointment to aa QD-BID PRN until resovled (patient already has for LS et A). Topical steroids (such as triamcinolone, fluocinolone, fluocinonide, mometasone, clobetasol, halobetasol, betamethasone, hydrocortisone) can cause thinning and lightening of the skin if they are used for too long in the same area. Your physician has selected the right strength medicine for your problem and area affected on the body. Please use your medication only as directed by your physician to prevent side effects.     Return for LS et A follow up in 4-6 weeks.  Luther Redo, CMA, am acting as scribe for Brendolyn Patty, MD .  Documentation: I have reviewed the above documentation for accuracy and completeness, and I agree with the above.  Brendolyn Patty MD

## 2022-04-27 NOTE — Assessment & Plan Note (Signed)
Checking urine studies.  I would only treat if her urine culture is positive.

## 2022-04-27 NOTE — Assessment & Plan Note (Addendum)
Patient definitely has a rash today which is very different than her exam from about a week and a half ago.  Concerning for lichen sclerosis versus some other inflammatory skin issue.  I will treat with topical clobetasol twice daily for 14 days and then twice weekly after that.  We will refer to dermatology.  She was advised not to put the clobetasol inside of her vagina.  Advised if the clobetasol is excessively expensive she is to let me know.  Discussed that lichen sclerosus is a chronic skin condition that is typically seen in postmenopausal women though can be seen in others.  Discussed that this would require lifelong treatment if it proves to be lichen sclerosus.

## 2022-04-27 NOTE — Telephone Encounter (Signed)
I called and spoke with patient see results note.  Irisa Grimsley,cma

## 2022-04-27 NOTE — Patient Instructions (Addendum)
Lichen sclerosus is a chronic inflammatory condition of unknown cause that frequently involves the vaginal area and less commonly extragenital skin, and is NOT sexually transmitted. It frequently causes symptoms of pain and burning.  It requires regular monitoring and treatment with topical steroids to minimize inflammation and to reduce risk of scarring. There is also a risk of cancer in the vaginal area which is very low if inflammation is well controlled. Regular checks of the area are recommended. Please call if you notice any new or changing spots within this area.  Topical steroids (such as triamcinolone, fluocinolone, fluocinonide, mometasone, clobetasol, halobetasol, betamethasone, hydrocortisone) can cause thinning and lightening of the skin if they are used for too long in the same area. Your physician has selected the right strength medicine for your problem and area affected on the body. Please use your medication only as directed by your physician to prevent side effects.    Due to recent changes in healthcare laws, you may see results of your pathology and/or laboratory studies on MyChart before the doctors have had a chance to review them. We understand that in some cases there may be results that are confusing or concerning to you. Please understand that not all results are received at the same time and often the doctors may need to interpret multiple results in order to provide you with the best plan of care or course of treatment. Therefore, we ask that you please give [place] 2 business days to thoroughly review all your results before contacting the office for clarification. Should we see a critical lab result, you will be contacted sooner.   If You Need Anything After Your Visit  If you have any questions or concerns for your doctor, please call our main line at [phone] and press option 4 to reach your doctor's medical assistant. If no one answers, please leave a voicemail as directed  and we will return your call as soon as possible. Messages left after 4 pm will be answered the following business day.   You may also send [place] a message via [place]. We typically respond to MyChart messages within 1-2 business days.  For prescription refills, please ask your pharmacy to contact our office. Our fax number is [phone].  If you have an urgent issue when the clinic is closed that cannot wait until the next business day, you can page your doctor at the number below.    Please note that while we do our best to be available for urgent issues outside of office hours, we are not available 24/7.   If you have an urgent issue and are unable to reach [place], you may choose to seek medical care at your doctor's office, retail clinic, urgent care center, or emergency room.  If you have a medical emergency, please immediately call 911 or go to the emergency department.  Pager Numbers  - Dr. Nehemiah Massed: [phone]  - Dr. Laurence Ferrari: [phone]  - Dr. Nicole Kindred: [phone]  In the event of inclement weather, please call our main line at [phone] for an update on the status of any delays or closures.  Dermatology Medication Tips: Please keep the boxes that topical medications come in in order to help keep track of the instructions about where and how to use these. Pharmacies typically print the medication instructions only on the boxes and not directly on the medication tubes.   If your medication is too expensive, please contact our office at [phone] option 4 or send [place] a message  through [place].   We are unable to tell what your co-pay for medications will be in advance as this is different depending on your insurance coverage. However, we may be able to find a substitute medication at lower cost or fill out paperwork to get insurance to cover a needed medication.   If a prior authorization is required to get your medication covered by your insurance company, please allow [place] 1-2  business days to complete this process.  Drug prices often vary depending on where the prescription is filled and some pharmacies may offer cheaper prices.  The website www.goodrx.com contains coupons for medications through different pharmacies. The prices here do not account for what the cost may be with help from insurance (it may be cheaper with your insurance), but the website can give you the price if you did not use any insurance.  - You can print the associated coupon and take it with your prescription to the pharmacy.  - You may also stop by our office during regular business hours and pick up a GoodRx coupon card.  - If you need your prescription sent electronically to a different pharmacy, notify our office through [facility] or by phone at [phone] option 4.     Si Usted Necesita Algo Despus de Su Visita  Tambin puede enviarnos un mensaje a travs de Pharmacist, community. Por lo general respondemos a los mensajes de MyChart en el transcurso de 1 a 2 das hbiles.  Para renovar recetas, por favor pida a su farmacia que se ponga en contacto con nuestra oficina. Harland Dingwall de fax es [place] [phone].  Si tiene un asunto urgente cuando la clnica est cerrada y que no puede esperar hasta el siguiente da hbil, puede llamar/localizar a su doctor(a) al nmero que aparece a continuacin.   Por favor, tenga en cuenta que aunque hacemos todo lo posible para estar disponibles para asuntos urgentes fuera del horario de [place], no estamos disponibles las 24 horas del da, los 7 das de la [place].   Si tiene un problema urgente y no puede comunicarse con nosotros, puede optar por buscar atencin mdica  en el consultorio de su doctor(a), en una clnica privada, en un centro de atencin urgente o en una sala de emergencias.  Si tiene Engineering geologist, por favor llame inmediatamente al 911 o vaya a la sala de emergencias.  Nmeros de bper  - Dr. Nehemiah Massed: [phone]  - Dra.  Moye: [phone]  - Dra. Nicole Kindred: [phone]  En caso de inclemencias del [place], por favor llame a Johnsie Kindred principal al [phone] para una actualizacin sobre el [place] de cualquier retraso o cierre.  Consejos para la medicacin en dermatologa: Por favor, guarde las cajas en las que vienen los medicamentos de uso tpico para ayudarle a seguir las instrucciones sobre dnde y cmo usarlos. Las farmacias generalmente imprimen las instrucciones del medicamento slo en las cajas y no directamente en los tubos del [place].   Si su medicamento es muy caro, por favor, pngase en contacto con Zigmund Daniel llamando al [phone] y presione la opcin 4 o envenos un mensaje a travs de Pharmacist, community.   No podemos decirle cul ser su copago por los medicamentos por adelantado ya que esto es diferente dependiendo de la cobertura de su seguro. Sin embargo, es posible que podamos encontrar un medicamento sustituto a Electrical engineer un formulario para que el seguro cubra el medicamento que se considera necesario.   Si se requiere Solicitor  previa para que su compaa de seguros [place] su medicamento, por favor permtanos de 1 a 2 das hbiles para completar este proceso.  Los precios de los medicamentos varan con frecuencia dependiendo del Environmental consultant de dnde se surte la receta y alguna farmacias pueden ofrecer precios ms baratos.  El sitio web www.goodrx.com tiene cupones para medicamentos de Airline pilot. Los precios aqu no tienen en cuenta lo que podra costar con la ayuda del seguro (puede ser ms barato con su seguro), pero el sitio web puede darle el precio si no utiliz Research scientist (physical sciences).  - Puede imprimir el cupn correspondiente y llevarlo con su receta a la farmacia.  - Tambin puede pasar por nuestra oficina durante el horario de atencin regular y Charity fundraiser una tarjeta de cupones de GoodRx.  - Si necesita que su receta se enve electrnicamente a una farmacia diferente,  informe a nuestra oficina a travs de MyChart de [place] o por telfono llamando al [phone] y presione la opcin 4.

## 2022-04-28 ENCOUNTER — Encounter: Payer: Self-pay | Admitting: Obstetrics and Gynecology

## 2022-04-28 ENCOUNTER — Ambulatory Visit (INDEPENDENT_AMBULATORY_CARE_PROVIDER_SITE_OTHER): Payer: BC Managed Care – PPO | Admitting: Obstetrics and Gynecology

## 2022-04-28 ENCOUNTER — Encounter: Payer: BC Managed Care – PPO | Admitting: Obstetrics and Gynecology

## 2022-04-28 VITALS — BP 100/60 | HR 68 | Resp 16 | Ht 61.0 in | Wt 125.0 lb

## 2022-04-28 DIAGNOSIS — N3000 Acute cystitis without hematuria: Secondary | ICD-10-CM | POA: Diagnosis not present

## 2022-04-28 DIAGNOSIS — R102 Pelvic and perineal pain: Secondary | ICD-10-CM

## 2022-04-28 DIAGNOSIS — N904 Leukoplakia of vulva: Secondary | ICD-10-CM | POA: Diagnosis not present

## 2022-04-28 MED ORDER — FLUCONAZOLE 150 MG PO TABS: 150.0000 mg | ORAL_TABLET | Freq: Once | ORAL | 3 refills | Status: AC

## 2022-04-28 MED ORDER — AMOXICILLIN 500 MG PO CAPS: 500.0000 mg | ORAL_CAPSULE | Freq: Three times a day (TID) | ORAL | 0 refills | Status: DC

## 2022-04-28 NOTE — Progress Notes (Signed)
GYNECOLOGY PROGRESS NOTE  Subjective:    Patient ID: Allison Hudson, female    DOB: 20-Jun-1971, 51 y.o.   MRN: [id number]  HPI  Patient is a 51 y.o. [id number] female who presents for pelvic pain. She has concerns about severe pelvic pain right lower quadrant, pain radiates to her back and down her leg. She has a history of intermittent pelvic pain, seen in both 2020 and 2021 for this issue. She has been bothered with the pain for about 2 weeks now. She has been evaluated by her PCP and Dermatology. She was diagnosed with Lichen Sclerosis by her PCP, recently treated with Clobetasol which has helped.  Also was treated recently for BV infection based on positive culture.  Notes Dermatology prescribed a stronger steroid cream for her lichens (but cannot recall the name, plans to pick up today). She was also tested for UTI, results did come back positive but she has not been treated with any medications.   The following portions of the patient's history were reviewed and updated as appropriate:  She  has a past medical history of Depression and Frequent headaches.  She  has a past surgical history that includes Abdominal hysterectomy and Colonoscopy with propofol (N/A, 05/06/2020).  Her family history includes Hyperlipidemia in her mother; Hypertension in her mother. She  reports that she has never smoked. She has never used smokeless tobacco. She reports current alcohol use of about 3.0 standard drinks of alcohol per week. She reports that she does not use drugs.  Current Outpatient Medications on File Prior to Visit  Medication Sig Dispense Refill   clobetasol ointment (TEMOVATE) [id number] % Apply 1 Application topically 2 (two) times daily. For 14 days and then apply once on Mondays and Thursdays 30 g 0   hydrocortisone-pramoxine (ANALPRAM-HC) 2.5-1 % rectal cream Apply to rash around rectal area BID PRN. 30 g 0   No current facility-administered medications on file prior to visit.   She has No  Known Allergies..  Review of Systems Pertinent items noted in HPI and remainder of comprehensive ROS otherwise negative.   Objective:   Blood pressure 100/60, pulse 68, resp. rate 16, height 5\' 1"  (1.549 m), weight 125 lb (56.7 kg).  There is no height or weight on file to calculate BMI. General appearance: alert and no distress Abdomen: soft, non-tender; bowel sounds normal; no masses,  no organomegaly Pelvic: external genitalia with faint lichenification on labia minora bilaterally.  Rectovaginal septum normal.  Vagina with moderate thick white discharge.  Cervix normal appearing, no lesions and no motion tenderness.  Uterus mobile, nontender, normal shape and size.  Adnexae non-palpable, nontender bilaterally. Extremities: extremities normal, atraumatic, no cyanosis or edema Neurologic: Grossly normal    Labs:  Office Visit on 04/24/2022  Component Date Value Ref Range Status   Color, UA 04/24/2022 yellow   Final   Clarity, UA 04/24/2022 clear   Final   Glucose, UA 04/24/2022 Negative  Negative Final   Bilirubin, UA 04/24/2022 negative   Final   Ketones, UA 04/24/2022 negative   Final   Spec Grav, UA 04/24/2022 1.010  1.010 - 1.025 Final   Blood, UA 04/24/2022 trace   Final   pH, UA 04/24/2022 6.0  5.0 - 8.0 Final   Protein, UA 04/24/2022 Negative  Negative Final   Urobilinogen, UA 04/24/2022 0.2  0.2 or 1.0 E.U./dL Final   Nitrite, UA 04/24/2022 negative   Final   Leukocytes, UA 04/24/2022 Negative  Negative Final  MICRO NUMBER: 04/24/2022 [id number]   Final   SPECIMEN QUALITY: 04/24/2022 Adequate   Final   Sample Source 04/24/2022 URINE   Final   STATUS: 04/24/2022 FINAL   Final   ISOLATE 1: 04/24/2022 Streptococcus agalactiae (A)   Final   Comment: 1,000-9,000 CFU/ML of Group B Streptococcus isolated Beta-hemolytic streptococci are predictably susceptible to Penicillin and other beta-lactams. Susceptibility testing not routinely performed. Please contact the laboratory within  3 days if  susceptibility testing is desired. Erythromycin and clindamycin are not recommended for treatment of urinary tract infections, but clindamycin may be useful for treatment of rectovaginal colonization or infection. Any amount of group B Streptococcus in  urine specimens obtained from pregnant females is a marker of genital tract colonization. If this patient is pregnant, please refer to ACOG guidelines for appropriate screening and management of pregnant women.    WBC, UA 04/24/2022 NONE SEEN  0 - 5 /HPF Final   RBC / HPF 04/24/2022 NONE SEEN  0 - 2 /HPF Final   Squamous Epithelial / LPF 04/24/2022 0-5  < OR = 5 /HPF Final   Bacteria, UA 04/24/2022 NONE SEEN  NONE SEEN /HPF Final   Hyaline Cast 04/24/2022 NONE SEEN  NONE SEEN /LPF Final   Note 04/24/2022    Final   Comment: This urine was analyzed for the presence of WBC,  RBC, bacteria, casts, and other formed elements.  Only those elements seen were reported. . .      04/13/22 13:56  Chlamydia Negative  Neisseria Gonorrhea Negative  Trichomonas Negative  Candida Vaginitis Negative  Candida Glabrata Negative  Bacterial Vaginitis (gardnerella) Positive !  !: Data is abnormal   Assessment:   1. Pelvic pain   2. Acute cystitis without hematuria   3. Lichen sclerosus et atrophicus of the vulva      Plan:   1. Pelvic pain - Unclear cause but patient has had several different issues including recent mild UTI (Strep), lichen sclerosis. Also with a previous history of intermittent pelvic pain, although this episode feels different. Recently treated for BV infection. Will treat for UTI.   2. Acute cystitis without hematuria - See above. Prescription for Amoxcillin sent in. Willa los send prescription for Diflucan for prophylactic yeast infection prevention.   3. Lichen sclerosus et atrophicus of the vulva -Patient currently being treated with clobetasol for lichen sclerosus.  Has improved based on description of previous  vaginal exam performed 1 to 2 weeks ago.  Also notes dermatology has prescribed a higher dose cream for her to utilize it and plans to pick up today.  Return if symptoms worsen or fail to improve.   Rubie Maid, MD [place]

## 2022-04-29 ENCOUNTER — Ambulatory Visit
Admission: RE | Admit: 2022-04-29 | Discharge: 2022-04-29 | Disposition: A | Discharge status: Home / Self Care | Payer: BC Managed Care – PPO | Source: Ambulatory Visit | Attending: Family Medicine | Admitting: Family Medicine

## 2022-04-29 DIAGNOSIS — Z1231 Encounter for screening mammogram for malignant neoplasm of breast: Secondary | ICD-10-CM | POA: Insufficient documentation

## 2022-05-25 ENCOUNTER — Telehealth: Payer: Self-pay | Admitting: Family Medicine

## 2022-05-25 NOTE — Telephone Encounter (Signed)
Patient would like to have lab work done on the morning of her 3:30 physical. She would like office to call her because she is requesting curtain labs. Please call her at [phone]

## 2022-05-27 ENCOUNTER — Encounter: Payer: Self-pay | Admitting: Family Medicine

## 2022-06-01 NOTE — Telephone Encounter (Signed)
lvm

## 2022-06-01 NOTE — Telephone Encounter (Signed)
LVM for patient to call back.   Stedman [place],cma  

## 2022-06-02 ENCOUNTER — Ambulatory Visit: Payer: BC Managed Care – PPO | Admitting: Dermatology

## 2022-06-02 NOTE — Telephone Encounter (Signed)
Patient called back and I Informed Morrie Sheldon to schedule the patient a physical and a lab prior to the physical and she understood and said she would schedule.  Jazion Atteberry,mca

## 2022-06-03 ENCOUNTER — Ambulatory Visit (INDEPENDENT_AMBULATORY_CARE_PROVIDER_SITE_OTHER): Payer: BC Managed Care – PPO | Admitting: Dermatology

## 2022-06-03 VITALS — BP 83/69 | HR 74

## 2022-06-03 DIAGNOSIS — L820 Inflamed seborrheic keratosis: Secondary | ICD-10-CM

## 2022-06-03 DIAGNOSIS — L900 Lichen sclerosus et atrophicus: Secondary | ICD-10-CM | POA: Diagnosis not present

## 2022-06-03 MED ORDER — CLOBETASOL PROPIONATE 0.05 % EX OINT: 1.0000 | TOPICAL_OINTMENT | CUTANEOUS | 4 refills | Status: DC

## 2022-06-03 MED ORDER — HYDROCORT-PRAMOXINE (PERIANAL) 2.5-1 % EX CREA: TOPICAL_CREAM | CUTANEOUS | 4 refills | Status: DC

## 2022-06-03 NOTE — Patient Instructions (Addendum)
Cryotherapy Aftercare  Wash gently with soap and water everyday.   Apply Vaseline and Band-Aid daily until healed.     Due to recent changes in healthcare laws, you may see results of your pathology and/or laboratory studies on MyChart before the doctors have had a chance to review them. We understand that in some cases there may be results that are confusing or concerning to you. Please understand that not all results are received at the same time and often the doctors may need to interpret multiple results in order to provide you with the best plan of care or course of treatment. Therefore, we ask that you please give us 2 business days to thoroughly review all your results before contacting the office for clarification. Should we see a critical lab result, you will be contacted sooner.   If You Need Anything After Your Visit  If you have any questions or concerns for your doctor, please call our main line at [phone] and press option 4 to reach your doctor's medical assistant. If no one answers, please leave a voicemail as directed and we will return your call as soon as possible. Messages left after 4 pm will be answered the following business day.   You may also send us a message via MyChart. We typically respond to MyChart messages within 1-2 business days.  For prescription refills, please ask your pharmacy to contact our office. Our fax number is [phone].  If you have an urgent issue when the clinic is closed that cannot wait until the next business day, you can page your doctor at the number below.    Please note that while we do our best to be available for urgent issues outside of office hours, we are not available 24/7.   If you have an urgent issue and are unable to reach us, you may choose to seek medical care at your doctor's office, retail clinic, urgent care center, or emergency room.  If you have a medical emergency, please immediately call 911 or go to the  emergency department.  Pager Numbers  - Dr. Kowalski: [phone]  - Dr. Moye: [phone]  - Dr. Stewart: [phone]  In the event of inclement weather, please call our main line at [phone] for an update on the status of any delays or closures.  Dermatology Medication Tips: Please keep the boxes that topical medications come in in order to help keep track of the instructions about where and how to use these. Pharmacies typically print the medication instructions only on the boxes and not directly on the medication tubes.   If your medication is too expensive, please contact our office at [phone] option 4 or send us a message through MyChart.   We are unable to tell what your co-pay for medications will be in advance as this is different depending on your insurance coverage. However, we may be able to find a substitute medication at lower cost or fill out paperwork to get insurance to cover a needed medication.   If a prior authorization is required to get your medication covered by your insurance company, please allow us 1-2 business days to complete this process.  Drug prices often vary depending on where the prescription is filled and some pharmacies may offer cheaper prices.  The website www.goodrx.com contains coupons for medications through different pharmacies. The prices here do not account for what the cost may be with help from insurance (it may be cheaper with your insurance), but the website can   give you the price if you did not use any insurance.  - You can print the associated coupon and take it with your prescription to the pharmacy.  - You may also stop by our office during regular business hours and pick up a GoodRx coupon card.  - If you need your prescription sent electronically to a different pharmacy, notify our office through [place] MyChart or by phone at [phone] option 4.     Si Usted Necesita Algo Despus de Su Visita  Tambin puede  enviarnos un mensaje a travs de MyChart. Por lo general respondemos a los mensajes de MyChart en el transcurso de 1 a 2 das hbiles.  Para renovar recetas, por favor pida a su farmacia que se ponga en contacto con nuestra oficina. Nuestro nmero de fax es el [phone].  Si tiene un asunto urgente cuando la clnica est cerrada y que no puede esperar hasta el siguiente da hbil, puede llamar/localizar a su doctor(a) al nmero que aparece a continuacin.   Por favor, tenga en cuenta que aunque hacemos todo lo posible para estar disponibles para asuntos urgentes fuera del horario de oficina, no estamos disponibles las 24 horas del da, los 7 das de la semana.   Si tiene un problema urgente y no puede comunicarse con nosotros, puede optar por buscar atencin mdica  en el consultorio de su doctor(a), en una clnica privada, en un centro de atencin urgente o en una sala de emergencias.  Si tiene una emergencia mdica, por favor llame inmediatamente al 911 o vaya a la sala de emergencias.  Nmeros de bper  - Dr. Kowalski: [phone]  - Dra. Moye: [phone]  - Dra. Stewart: [phone]  En caso de inclemencias del tiempo, por favor llame a nuestra lnea principal al [phone] para una actualizacin sobre el estado de cualquier retraso o cierre.  Consejos para la medicacin en dermatologa: Por favor, guarde las cajas en las que vienen los medicamentos de uso tpico para ayudarle a seguir las instrucciones sobre dnde y cmo usarlos. Las farmacias generalmente imprimen las instrucciones del medicamento slo en las cajas y no directamente en los tubos del medicamento.   Si su medicamento es muy caro, por favor, pngase en contacto con nuestra oficina llamando al [phone] y presione la opcin 4 o envenos un mensaje a travs de MyChart.   No podemos decirle cul ser su copago por los medicamentos por adelantado ya que esto es diferente dependiendo de la cobertura de su seguro.  Sin embargo, es posible que podamos encontrar un medicamento sustituto a menor costo o llenar un formulario para que el seguro cubra el medicamento que se considera necesario.   Si se requiere una autorizacin previa para que su compaa de seguros cubra su medicamento, por favor permtanos de 1 a 2 das hbiles para completar este proceso.  Los precios de los medicamentos varan con frecuencia dependiendo del lugar de dnde se surte la receta y alguna farmacias pueden ofrecer precios ms baratos.  El sitio web www.goodrx.com tiene cupones para medicamentos de diferentes farmacias. Los precios aqu no tienen en cuenta lo que podra costar con la ayuda del seguro (puede ser ms barato con su seguro), pero el sitio web puede darle el precio si no utiliz ningn seguro.  - Puede imprimir el cupn correspondiente y llevarlo con su receta a la farmacia.  - Tambin puede pasar por nuestra oficina durante el horario de atencin regular y recoger una tarjeta de cupones de GoodRx.  -   Si necesita que su receta se enve electrnicamente a una farmacia diferente, informe a nuestra oficina a travs de MyChart de [place] o por telfono llamando al [phone] y presione la opcin 4.  

## 2022-06-03 NOTE — Progress Notes (Signed)
   Follow-Up Visit   Subjective  Allison Hudson is a 51 y.o. 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 Gets irritated.   The following portions of the chart were reviewed this encounter and updated as appropriate:       Review of Systems:  No other skin or systemic complaints except as noted in HPI or Assessment and Plan.  Objective  Well appearing patient in no apparent distress; mood and affect are within normal limits.  A focused examination was performed including groin. Relevant physical exam findings are noted in the Assessment and Plan.  groin Mild erythema labia majora  L ant hip x 1 Stuck on waxy pap with erythema    Assessment & Plan  Lichen sclerosus et atrophicus groin  Chronic condition with duration or expected duration over one year. Much improved and currently well-controlled.   Lichen sclerosus is a chronic inflammatory condition of unknown cause that frequently involves the vaginal area and less commonly extragenital skin, and is NOT sexually transmitted. It frequently causes symptoms of pain and burning.  It requires regular monitoring and treatment with topical steroids to minimize inflammation and to reduce risk of scarring. There is also a risk of cancer in the vaginal area which is very low if inflammation is well controlled. Regular checks of the area are recommended. Please call if you notice any new or changing spots within this area.  Cont Clobetasol oint 2x/wk to vaginal area for maintenance, if flares may increase to bid for up to 2 weeks (avoid applying to creases risk stretch marks) Cont Analpram bid prn itching perianal area  hydrocortisone-pramoxine (ANALPRAM-HC) 2.5-1 % rectal cream - groin Apply to rash around rectal area BID PRN itching  clobetasol ointment  (TEMOVATE) 0.05 % - groin Apply 1 Application topically as directed. Apply to rash in groin 2 times a week for maintenance, if flares may use bid for up to 2 weeks, avoid face, axilla  Inflamed seborrheic keratosis L ant hip x 1  Symptomatic, irritating, patient would like treated.   Destruction of lesion - L ant hip x 1  Destruction method: cryotherapy   Informed consent: discussed and consent obtained   Lesion destroyed using liquid nitrogen: Yes   Region frozen until ice ball extended beyond lesion: Yes   Outcome: patient tolerated procedure well with no complications   Post-procedure details: wound care instructions given   Additional details:  Prior to procedure, discussed risks of blister formation, small wound, skin dyspigmentation, or rare scar following cryotherapy. Recommend Vaseline ointment to treated areas while healing.    Return in about 6 months (around 12/03/2022) for LS&A.  I, Ardis Rowan, RMA, am acting as scribe for Willeen Niece, MD .   Documentation: I have reviewed the above documentation for accuracy and completeness, and I agree with the above.  Willeen Niece MD

## 2022-06-04 ENCOUNTER — Telehealth: Payer: Self-pay | Admitting: Family Medicine

## 2022-06-04 ENCOUNTER — Ambulatory Visit (INDEPENDENT_AMBULATORY_CARE_PROVIDER_SITE_OTHER): Payer: BC Managed Care – PPO

## 2022-06-04 ENCOUNTER — Other Ambulatory Visit (HOSPITAL_COMMUNITY)
Admission: RE | Admit: 2022-06-04 | Discharge: 2022-06-04 | Disposition: A | Discharge status: Home / Self Care | Payer: BC Managed Care – PPO | Source: Ambulatory Visit | Attending: Obstetrics and Gynecology | Admitting: Obstetrics and Gynecology

## 2022-06-04 VITALS — BP 119/73 | HR 83 | Resp 16 | Ht 61.0 in | Wt 120.0 lb

## 2022-06-04 DIAGNOSIS — Z1321 Encounter for screening for nutritional disorder: Secondary | ICD-10-CM

## 2022-06-04 DIAGNOSIS — Z13220 Encounter for screening for lipoid disorders: Secondary | ICD-10-CM

## 2022-06-04 DIAGNOSIS — N898 Other specified noninflammatory disorders of vagina: Secondary | ICD-10-CM

## 2022-06-04 DIAGNOSIS — R350 Frequency of micturition: Secondary | ICD-10-CM | POA: Diagnosis not present

## 2022-06-04 DIAGNOSIS — T387X5S Adverse effect of androgens and anabolic congeners, sequela: Secondary | ICD-10-CM

## 2022-06-04 DIAGNOSIS — R109 Unspecified abdominal pain: Secondary | ICD-10-CM

## 2022-06-04 DIAGNOSIS — Z130 Encounter for screening for diseases of the blood and blood-forming organs and certain disorders involving the immune mechanism: Secondary | ICD-10-CM

## 2022-06-04 DIAGNOSIS — Z131 Encounter for screening for diabetes mellitus: Secondary | ICD-10-CM

## 2022-06-04 DIAGNOSIS — Z90710 Acquired absence of both cervix and uterus: Secondary | ICD-10-CM

## 2022-06-04 DIAGNOSIS — Z1329 Encounter for screening for other suspected endocrine disorder: Secondary | ICD-10-CM

## 2022-06-04 LAB — POCT URINALYSIS DIPSTICK
Bilirubin, UA: NEGATIVE
Blood, UA: NEGATIVE
Glucose, UA: NEGATIVE
Ketones, UA: NEGATIVE
Leukocytes, UA: NEGATIVE
Nitrite, UA: NEGATIVE
Protein, UA: NEGATIVE
Spec Grav, UA: 1.02 (ref 1.010–1.025)
Urobilinogen, UA: 0.2 E.U./dL
pH, UA: 6 (ref 5.0–8.0)

## 2022-06-04 NOTE — Progress Notes (Addendum)
  HPI Patient presents for UTI symptoms. Notes flank pain and urinary urgency for 3 days.   Santiago Bumpers, CMA [facility]

## 2022-06-04 NOTE — Telephone Encounter (Signed)
Patient has a lab appointment 06/10/2022, there are no orders in. 

## 2022-06-07 LAB — URINE CULTURE

## 2022-06-07 NOTE — Addendum Note (Signed)
Addended by: Glori Luis on: 06/07/2022 08:21 AM   Modules accepted: Orders

## 2022-06-08 ENCOUNTER — Telehealth: Payer: Self-pay

## 2022-06-08 LAB — CERVICOVAGINAL ANCILLARY ONLY
Bacterial Vaginitis (gardnerella): POSITIVE — AB
Candida Glabrata: NEGATIVE
Candida Vaginitis: NEGATIVE
Comment: NEGATIVE
Comment: NEGATIVE
Comment: NEGATIVE

## 2022-06-08 NOTE — Telephone Encounter (Signed)
Hey Dr. Valentino Allison Hudson patient came in as Nurse Visit last week with recurrent UTI. Results are back. She has questions about what type of bacteria she has and why she continues to get these UTI's. Should I go ahead and send in medication for her UTI according to what it is susceptible to.

## 2022-06-09 ENCOUNTER — Other Ambulatory Visit: Payer: Self-pay

## 2022-06-09 MED ORDER — METRONIDAZOLE 500 MG PO TABS: 500.0000 mg | ORAL_TABLET | Freq: Two times a day (BID) | ORAL | 0 refills | Status: DC

## 2022-06-09 MED ORDER — NITROFURANTOIN MONOHYD MACRO 100 MG PO CAPS: 100.0000 mg | ORAL_CAPSULE | Freq: Two times a day (BID) | ORAL | 0 refills | Status: DC

## 2022-06-09 MED ORDER — FLUCONAZOLE 150 MG PO TABS: 150.0000 mg | ORAL_TABLET | Freq: Once | ORAL | 0 refills | Status: AC

## 2022-06-09 NOTE — Telephone Encounter (Signed)
Pt calling; is a little upset that she hasn't heard anything back from CB or Dr. Valentino Saxon needs to know what the next steps are for this problem.  [phone]

## 2022-06-09 NOTE — Telephone Encounter (Signed)
Patient called.  Patient aware.  

## 2022-06-10 ENCOUNTER — Other Ambulatory Visit (INDEPENDENT_AMBULATORY_CARE_PROVIDER_SITE_OTHER): Payer: BC Managed Care – PPO

## 2022-06-10 DIAGNOSIS — Z1329 Encounter for screening for other suspected endocrine disorder: Secondary | ICD-10-CM | POA: Diagnosis not present

## 2022-06-10 DIAGNOSIS — Z13220 Encounter for screening for lipoid disorders: Secondary | ICD-10-CM

## 2022-06-10 DIAGNOSIS — Z130 Encounter for screening for diseases of the blood and blood-forming organs and certain disorders involving the immune mechanism: Secondary | ICD-10-CM | POA: Diagnosis not present

## 2022-06-10 DIAGNOSIS — Z1321 Encounter for screening for nutritional disorder: Secondary | ICD-10-CM | POA: Diagnosis not present

## 2022-06-10 DIAGNOSIS — Z90710 Acquired absence of both cervix and uterus: Secondary | ICD-10-CM | POA: Diagnosis not present

## 2022-06-10 DIAGNOSIS — Z131 Encounter for screening for diabetes mellitus: Secondary | ICD-10-CM

## 2022-06-10 DIAGNOSIS — T387X5S Adverse effect of androgens and anabolic congeners, sequela: Secondary | ICD-10-CM

## 2022-06-10 LAB — COMPREHENSIVE METABOLIC PANEL
ALT: 19 U/L (ref 0–35)
AST: 20 U/L (ref 0–37)
Albumin: 4.8 g/dL (ref 3.5–5.2)
Alkaline Phosphatase: 77 U/L (ref 39–117)
BUN: 18 mg/dL (ref 6–23)
CO2: 24 mEq/L (ref 19–32)
Calcium: 9.3 mg/dL (ref 8.4–10.5)
Chloride: 104 mEq/L (ref 96–112)
Creatinine, Ser: 0.81 mg/dL (ref 0.40–1.20)
GFR: 84.5 mL/min (ref >60.00)
Glucose, Bld: 92 mg/dL (ref 70–99)
Potassium: 3.8 mEq/L (ref 3.5–5.1)
Sodium: 141 mEq/L (ref 135–145)
Total Bilirubin: 0.5 mg/dL (ref 0.2–1.2)
Total Protein: 7.4 g/dL (ref 6.0–8.3)

## 2022-06-10 LAB — LIPID PANEL
Cholesterol: 207 mg/dL — ABNORMAL HIGH (ref 0–200)
HDL: 70.6 mg/dL (ref >39.00)
LDL Cholesterol: 124 mg/dL — ABNORMAL HIGH (ref 0–99)
NonHDL: 136.57
Total CHOL/HDL Ratio: 3
Triglycerides: 63 mg/dL (ref 0.0–149.0)
VLDL: 12.6 mg/dL (ref 0.0–40.0)

## 2022-06-10 LAB — T3, FREE: T3, Free: 3.4 pg/mL (ref 2.3–4.2)

## 2022-06-10 LAB — CBC
HCT: 43.6 % (ref 36.0–46.0)
Hemoglobin: 14.8 g/dL (ref 12.0–15.0)
MCHC: 34 g/dL (ref 30.0–36.0)
MCV: 95 fl (ref 78.0–100.0)
Platelets: 256 10*3/uL (ref 150.0–400.0)
RBC: 4.59 Mil/uL (ref 3.87–5.11)
RDW: 13.7 % (ref 11.5–15.5)
WBC: 5.1 10*3/uL (ref 4.0–10.5)

## 2022-06-10 LAB — TSH: TSH: 1.57 u[IU]/mL (ref 0.35–5.50)

## 2022-06-10 LAB — VITAMIN D 25 HYDROXY (VIT D DEFICIENCY, FRACTURES): VITD: 35.21 ng/mL (ref 30.00–100.00)

## 2022-06-10 LAB — HEMOGLOBIN A1C: Hgb A1c MFr Bld: 5.5 % (ref 4.6–6.5)

## 2022-06-10 LAB — FOLATE: Folate: 11.5 ng/mL (ref >5.9)

## 2022-06-10 LAB — FOLLICLE STIMULATING HORMONE: FSH: 12.9 m[IU]/mL

## 2022-06-10 LAB — VITAMIN B12: Vitamin B-12: 319 pg/mL (ref 211–911)

## 2022-06-10 LAB — LUTEINIZING HORMONE: LH: 12.22 m[IU]/mL

## 2022-06-10 NOTE — Addendum Note (Signed)
Addended by: Glori Luis on: 06/10/2022 08:03 AM   Modules accepted: Orders

## 2022-06-10 NOTE — Telephone Encounter (Signed)
Orders signed as requested by patient. She previously noted there is another provider that needs labs for her history of testosterone use.

## 2022-06-10 NOTE — Telephone Encounter (Signed)
Patient called and has been scheduled for TOC in 3 weeks.

## 2022-06-11 LAB — PROGESTERONE: Progesterone: 1.5 ng/mL

## 2022-06-11 LAB — THYROID PEROXIDASE ANTIBODY: Thyroperoxidase Ab SerPl-aCnc: 1 IU/mL (ref <9)

## 2022-06-11 LAB — T4: T4, Total: 10.2 ug/dL (ref 5.1–11.9)

## 2022-06-17 NOTE — Progress Notes (Signed)
   GYNECOLOGY PROGRESS NOTE   Subjective:      Subjective  Patient ID: Allison Hudson, female    DOB: 1970-11-22, 51 y.o.   MRN: [id number]   HPI  Patient is a 51 y.o. [id number] female who presents for UTI symptoms. She states right flank pain that radiates to her back, urinary urgency and frequency that is constant x 3 days. She has not tried to treat symptoms with anything otc.  Urine dipstick preformed, showing negative results. Per office protocol  urine culture sent. She also complains of vaginal irritation.   The following portions of the patient's history were reviewed and updated as appropriate: allergies, current medications, past family history, past medical history, past social history, past surgical history, and problem list.   Review of Systems Pertinent items noted in HPI and remainder of comprehensive ROS otherwise negative.     Objective:   Blood pressure 119/73, pulse 83, resp. rate 16, height 5\' 1"  (1.549 m), weight 120 lb (54.4 kg). Body mass index is 22.67 kg/m.   General appearance: alert, cooperative, and no distress     Assessment:    1. Urinary frequency   2. Right flank pain   3. Vaginal irritation       Plan:    - I recommended Increase water intake and add cranberry juice.    Take extra strength Tylenol per label instructions or Ibuprofen 800 mg every 8 hours if you are not pregnant.    AZO otc per label instructions.    - Ancillary swab for yeast and bacteria vaginosis.       , MD [facility]

## 2022-06-24 ENCOUNTER — Encounter: Payer: Self-pay | Admitting: Obstetrics and Gynecology

## 2022-06-24 ENCOUNTER — Other Ambulatory Visit (HOSPITAL_COMMUNITY)
Admission: RE | Admit: 2022-06-24 | Discharge: 2022-06-24 | Disposition: A | Discharge status: Home / Self Care | Payer: BC Managed Care – PPO | Source: Ambulatory Visit | Attending: Obstetrics and Gynecology | Admitting: Obstetrics and Gynecology

## 2022-06-24 ENCOUNTER — Ambulatory Visit (INDEPENDENT_AMBULATORY_CARE_PROVIDER_SITE_OTHER): Payer: BC Managed Care – PPO | Admitting: Obstetrics and Gynecology

## 2022-06-24 VITALS — BP 100/73 | HR 64 | Resp 16 | Ht 61.0 in | Wt 124.4 lb

## 2022-06-24 DIAGNOSIS — R102 Pelvic and perineal pain: Secondary | ICD-10-CM

## 2022-06-24 DIAGNOSIS — Z8742 Personal history of other diseases of the female genital tract: Secondary | ICD-10-CM | POA: Diagnosis not present

## 2022-06-24 DIAGNOSIS — Z87440 Personal history of urinary (tract) infections: Secondary | ICD-10-CM | POA: Diagnosis not present

## 2022-06-24 NOTE — Progress Notes (Signed)
    GYNECOLOGY PROGRESS NOTE  Subjective:    Patient ID: Allison Hudson, female    DOB: [birth year], 52 y.o.   MRN: [id number]  HPI  Patient is a 52 y.o. [id number] female who presents for [facility] for urinary tract infection and bacteria vaginosis. She was tested on 06/04/2022 and the results came back positive. She reports that she continues to have the same symptoms. She took medication as prescribed, but still has pain. Pain is on the right side, gradual gets worse throghout the day. Starts out as dulling, pulling sensation. Takes Ibuprofen which helps some but is frustrated by the pain. Has been ongoing for almost 2 months.   The following portions of the patient's history were reviewed and updated as appropriate: allergies, current medications, past family history, past medical history, past social history, past surgical history, and problem list.  Review of Systems Pertinent items noted in HPI and remainder of comprehensive ROS otherwise negative.   Objective:  Blood pressure 100/73, pulse 64, resp. rate 16, height 5\' 1"  (1.549 m), weight 124 lb 6.4 oz (56.4 kg). Body mass index is 23.51 kg/m. General appearance: alert, cooperative, and no distress Abdomen:  soft, no masses or organomegaly,  mildly tender in right lower quadrant.  Pelvic: external genitalia normal, rectovaginal septum normal.  Vagina without discharge.  Cervix normal appearing, several small nabothian cysts, stenotic cervical os.  Uterus mobile, nontender, normal shape and size.  Adnexae non-palpable, right tenderness present (moderate), left side non-tender.   Extremities: extremities normal, atraumatic, no cyanosis or edema Neurologic: Grossly normal   Imaging: . Narrative & Impression  Patient Name: Allison Hudson DOB: 02/13/1971 MRN: [id number] ULTRASOUND REPORT   Location: [facility] Date of Service: 04/30/2020        Indications:Pelvic Pain    Findings:  Uterus was surgically removed. Cervix  with 4 nabothian cysts. Largest two are: 1 at 1.1 cm and 2 at 0.9 cm,   Right Ovary measures 2.4 x 1.1 x 1.9 cm. It is normal in appearance. Left Ovary measures 2.5 x 1.6 x 2.5 cm. It is not normal in appearance.Solid round complex echoic ovarian mass with peripheral color flow Doppler seen, measuring 1.5 x 1.3 x 1.5 cm. Survey of the adnexa demonstrates no adnexal masses. There is no free fluid in the cul de sac.   Impression: 1. Lt solid ovarian masses as noted above. 2. Nabothian cysts.   Recommendations: 1.Clinical correlation with the patient's History and Physical Exam.     Jenine M. Albertine Grates    RDMS     I have reviewed this study and agree with documented findings.      Rubie Maid, MD     Assessment:   1. Pelvic pain   2. History of ovarian cyst   3. History of UTI      Plan:   1. Pelvic pain - Cervicovaginal ancillary performed for TOC to ensure no residual infection as cause of pain.  - Urine Culture for TOC.  - Pelvic ultrasound ordered  2. History of ovarian cyst - Pelvic ultrasound ordered.   3. History of UTI - TOC performed to ensure adequate treatment.   Follow up in 2-3 weeks after ultrasound.    Rubie Maid, MD [place]

## 2022-06-25 DIAGNOSIS — R102 Pelvic and perineal pain: Secondary | ICD-10-CM | POA: Diagnosis not present

## 2022-06-26 ENCOUNTER — Telehealth: Payer: Self-pay | Admitting: Family Medicine

## 2022-06-26 LAB — CERVICOVAGINAL ANCILLARY ONLY

## 2022-06-26 NOTE — Telephone Encounter (Signed)
Allison Hudson from [place] called in staying that due to an accident at the lab, the Testoterone, Free, Total, SHBG cannot be completed.

## 2022-06-26 NOTE — Telephone Encounter (Signed)
Please let the patient know about this. It appears that this was an issue once the sample got to [facility].  This lab will need to be drawn again.

## 2022-06-26 NOTE — Telephone Encounter (Signed)
I called the pateint and she is scheduled for a redraw of labs this week.  Artha Chiasson,cma

## 2022-06-27 LAB — URINE CULTURE: Organism ID, Bacteria: NO GROWTH

## 2022-06-29 ENCOUNTER — Other Ambulatory Visit: Payer: Self-pay

## 2022-06-29 DIAGNOSIS — R7989 Other specified abnormal findings of blood chemistry: Secondary | ICD-10-CM

## 2022-06-29 DIAGNOSIS — Z1329 Encounter for screening for other suspected endocrine disorder: Secondary | ICD-10-CM

## 2022-06-29 LAB — TESTOSTERONE, FREE, TOTAL, SHBG

## 2022-06-29 NOTE — Addendum Note (Signed)
Addended by: Leone Haven on: 06/29/2022 01:47 PM   Modules accepted: Orders

## 2022-07-02 ENCOUNTER — Other Ambulatory Visit: Payer: BC Managed Care – PPO

## 2022-07-02 DIAGNOSIS — R7989 Other specified abnormal findings of blood chemistry: Secondary | ICD-10-CM | POA: Diagnosis not present

## 2022-07-09 ENCOUNTER — Other Ambulatory Visit: Payer: Self-pay

## 2022-07-09 ENCOUNTER — Other Ambulatory Visit: Payer: BC Managed Care – PPO

## 2022-07-09 DIAGNOSIS — R102 Pelvic and perineal pain: Secondary | ICD-10-CM

## 2022-07-09 DIAGNOSIS — Z8742 Personal history of other diseases of the female genital tract: Secondary | ICD-10-CM

## 2022-07-10 ENCOUNTER — Telehealth: Payer: Self-pay | Admitting: Obstetrics and Gynecology

## 2022-07-10 ENCOUNTER — Ambulatory Visit: Payer: BC Managed Care – PPO | Admitting: Obstetrics and Gynecology

## 2022-07-10 NOTE — Telephone Encounter (Signed)
Patient is returning missed call. Please advise 

## 2022-07-11 LAB — TESTOSTERONE, FREE, TOTAL, SHBG
Sex Hormone Binding: 85.9 nmol/L (ref 17.3–125.0)
Testosterone, Free: 0.9 pg/mL (ref 0.0–4.2)
Testosterone: 4 ng/dL (ref 4–50)

## 2022-07-13 ENCOUNTER — Encounter: Payer: Self-pay | Admitting: Family Medicine

## 2022-07-13 ENCOUNTER — Ambulatory Visit (INDEPENDENT_AMBULATORY_CARE_PROVIDER_SITE_OTHER): Payer: BC Managed Care – PPO | Admitting: Family Medicine

## 2022-07-13 VITALS — BP 110/80 | HR 64 | Temp 98.6°F | Ht 61.0 in | Wt 119.8 lb

## 2022-07-13 DIAGNOSIS — Z0000 Encounter for general adult medical examination without abnormal findings: Secondary | ICD-10-CM

## 2022-07-13 NOTE — Progress Notes (Signed)
Tommi Rumps, MD Phone: [phone]  Allison Hudson is a 52 y.o. female who presents today for CPE.  Diet: Very healthy, 5 small meals a day, has a nutritionist Exercise: Patient does yoga, cardio, and lifts weights Pap smear: 04/12/2020 neg HPV NILM Colonoscopy: 05/06/20 Mammogram: 04/29/22 negative Family history-  Colon cancer: no  Breast cancer: no  Ovarian cancer: no Menses: Status post hysterectomy Vaccines-   Flu: declines  Tetanus: reports in the past 10 years  Shingles: declines  COVID19: Declines HIV screening: Notes she has had in the past Hep C Screening: Notes she has had in the past Tobacco use: No Alcohol use: 1 beverage every 2 weeks Illicit Drug use: No Dentist: Yes Ophthalmology: Yes   Active Ambulatory Problems    Diagnosis Date Noted   Anxiety and depression 07/02/2016   Insomnia 07/02/2016   Encounter for monitoring testosterone replacement therapy 03/02/2017   Pelvic pain 08/30/2017   Fatigue 08/02/2018   Elevated BP without diagnosis of hypertension 08/02/2018   Cough 01/15/2020   Routine general medical examination at a health care facility 04/12/2020   Dyspareunia in female 04/12/2020   Hair loss 05/26/2021   Labia irritation 04/13/2022   Urine frequency 74/25/9563   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/28/2022   Resolved Ambulatory Problems    Diagnosis Date Noted   Influenza-like illness 07/02/2016   Past Medical History:  Diagnosis Date   Depression    Frequent headaches     Family History  Problem Relation Age of Onset   Hypertension Mother    Hyperlipidemia Mother    Breast cancer Neg Hx     Social History   Socioeconomic History   Marital status: Single    Spouse name: Not on file   Number of children: Not on file   Years of education: Not on file   Highest education level: Not on file  Occupational History   Not on file  Tobacco Use   Smoking status: Never   Smokeless tobacco: Never  Vaping Use    Vaping Use: Never used  Substance and Sexual Activity   Alcohol use: Yes    Alcohol/week: 3.0 standard drinks of alcohol    Types: 3 Standard drinks or equivalent per week    Comment: occass   Drug use: No   Sexual activity: Yes    Partners: Male    Birth control/protection: Surgical    Comment: hyster  Other Topics Concern   Not on file  Social History Narrative   Not on file   Social Determinants of Health   Financial Resource Strain: Not on file  Food Insecurity: Not on file  Transportation Needs: Not on file  Physical Activity: Not on file  Stress: Not on file  Social Connections: Not on file  Intimate Partner Violence: Not on file    ROS  General:  Negative for nexplained weight loss, fever Skin: Negative for new or changing mole, sore that won't heal HEENT: Negative for trouble hearing, trouble seeing, ringing in ears, mouth sores, hoarseness, change in voice, dysphagia. CV:  Negative for chest pain, dyspnea, edema, palpitations Resp: Negative for cough, dyspnea, hemoptysis GI: Negative for nausea, vomiting, diarrhea, constipation, abdominal pain, melena, hematochezia. GU: Negative for dysuria, incontinence, urinary hesitance, hematuria, vaginal or penile discharge, polyuria, sexual difficulty, lumps in testicle or breasts MSK: Negative for muscle cramps or aches, joint pain or swelling Neuro: Negative for headaches, weakness, numbness, dizziness, passing out/fainting Psych: Negative for depression, anxiety, memory problems  Objective  Physical Exam Vitals:   07/13/22 1535  BP: 110/80  Pulse: 64  Temp: 98.6 F (37 C)  SpO2: 99%    BP Readings from Last 3 Encounters:  07/13/22 110/80  06/24/22 100/73  06/04/22 119/73   Wt Readings from Last 3 Encounters:  07/13/22 119 lb 12.8 oz (54.3 kg)  06/24/22 124 lb 6.4 oz (56.4 kg)  06/04/22 120 lb (54.4 kg)    Physical Exam Constitutional:      General: She is not in acute distress.    Appearance: She  is not diaphoretic.  HENT:     Head: Normocephalic and atraumatic.  Cardiovascular:     Rate and Rhythm: Normal rate and regular rhythm.     Heart sounds: Normal heart sounds.  Pulmonary:     Effort: Pulmonary effort is normal.     Breath sounds: Normal breath sounds.  Abdominal:     General: Bowel sounds are normal. There is no distension.     Palpations: Abdomen is soft.     Tenderness: There is no abdominal tenderness.  Musculoskeletal:     Right lower leg: No edema.     Left lower leg: No edema.  Lymphadenopathy:     Cervical: No cervical adenopathy.  Skin:    General: Skin is warm and dry.  Neurological:     Mental Status: She is alert.  Psychiatric:        Mood and Affect: Mood normal.      Assessment/Plan:   Routine general medical examination at a health care facility Assessment & Plan: Physical exam completed.  Encouraged continued healthy diet and exercise.  Lab work was reviewed with the patient.  Cancer screening is up-to-date.  She declines flu and COVID vaccinations.  She will consider the Shingrix vaccine and get this if she decides to do.  Patient does report she has an ultrasound through GYN in the near future to evaluate for possible mass in her pelvis.     Return in about 1 year (around 07/14/2023) for physical.   Tommi Rumps, MD [place]

## 2022-07-13 NOTE — Assessment & Plan Note (Addendum)
Physical exam completed.  Encouraged continued healthy diet and exercise.  Lab work was reviewed with the patient.  Cancer screening is up-to-date.  She declines flu and COVID vaccinations.  She will consider the Shingrix vaccine and get this if she decides to do.  Patient does report she has an ultrasound through GYN in the near future to evaluate for possible mass in her pelvis.

## 2022-07-13 NOTE — Telephone Encounter (Signed)
Patient is scheduled for 1/24 for ultrasound

## 2022-07-14 ENCOUNTER — Ambulatory Visit: Payer: BC Managed Care – PPO | Admitting: Obstetrics and Gynecology

## 2022-07-15 ENCOUNTER — Ambulatory Visit: Payer: BC Managed Care – PPO

## 2022-07-29 ENCOUNTER — Ambulatory Visit: Admission: RE | Admit: 2022-07-29 | Payer: BC Managed Care – PPO | Source: Ambulatory Visit

## 2022-11-16 ENCOUNTER — Ambulatory Visit
Admission: EM | Admit: 2022-11-16 | Discharge: 2022-11-16 | Disposition: A | Discharge status: Home / Self Care | Payer: BC Managed Care – PPO | Attending: Emergency Medicine | Admitting: Emergency Medicine

## 2022-11-16 DIAGNOSIS — H6691 Otitis media, unspecified, right ear: Secondary | ICD-10-CM

## 2022-11-16 DIAGNOSIS — J069 Acute upper respiratory infection, unspecified: Secondary | ICD-10-CM | POA: Diagnosis not present

## 2022-11-16 MED ORDER — AMOXICILLIN 875 MG PO TABS: 875.0000 mg | ORAL_TABLET | Freq: Two times a day (BID) | ORAL | 0 refills | Status: AC

## 2022-11-16 NOTE — ED Provider Notes (Signed)
Renaldo Fiddler    CSN: [id number] Arrival date & time: 11/16/22  0801      History   Chief Complaint Chief Complaint  Patient presents with   Otalgia   Sore Throat    HPI Allison Hudson is a 52 y.o. female.  Patient presents with 4 day history of ear pain, sore throat, cough.  Her right ear has gotten worse and has increased pain.  No fever, rash, ear drainage, shortness of breath, or other symptoms.  Treating with Nyquil; no OTC medications taken today.  Her medical history includes frequent headaches, anxiety, depression.    The history is provided by the patient and medical records.    Past Medical History:  Diagnosis Date   Depression    Frequent headaches     Patient Active Problem List   Diagnosis Date Noted   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/28/2022   Urine frequency 04/27/2022   Labia irritation 04/13/2022   Hair loss 05/26/2021   Routine general medical examination at a health care facility 04/12/2020   Dyspareunia in female 04/12/2020   Cough 01/15/2020   Fatigue 08/02/2018   Elevated BP without diagnosis of hypertension 08/02/2018   Pelvic pain 08/30/2017   Encounter for monitoring testosterone replacement therapy 03/02/2017   Anxiety and depression 07/02/2016   Insomnia 07/02/2016    Past Surgical History:  Procedure Laterality Date   ABDOMINAL HYSTERECTOMY     AUGMENTATION MAMMAPLASTY Bilateral 2014   COLONOSCOPY WITH PROPOFOL N/A 05/06/2020   Procedure: COLONOSCOPY WITH PROPOFOL;  Surgeon: Wyline Mood, MD;  Location: [facility] ENDOSCOPY;  Service: Gastroenterology;  Laterality: N/A;    OB History     Gravida  5   Para  4   Term  3   Preterm  1   AB  1   Living  4      SAB  1   IAB      Ectopic      Multiple      Live Births  4            Home Medications    Prior to Admission medications   Medication Sig Start Date End Date Taking? Authorizing Provider  amoxicillin (AMOXIL) 875 MG tablet Take 1  tablet (875 mg total) by mouth 2 (two) times daily for 10 days. 11/16/22 11/26/22 Yes Mickie Bail, NP  clobetasol ointment (TEMOVATE) 0.05 % Apply 1 Application topically as directed. Apply to rash in groin 2 times a week for maintenance, if flares may use bid for up to 2 weeks, avoid face, axilla 06/03/22   Willeen Niece, MD  hydrocortisone-pramoxine [facility]) 2.5-1 % rectal cream Apply to rash around rectal area BID PRN itching 06/03/22   Willeen Niece, MD    Family History Family History  Problem Relation Age of Onset   Hypertension Mother    Hyperlipidemia Mother    Breast cancer Neg Hx     Social History Social History   Tobacco Use   Smoking status: Never   Smokeless tobacco: Never  Vaping Use   Vaping Use: Never used  Substance Use Topics   Alcohol use: Yes    Alcohol/week: 3.0 standard drinks of alcohol    Types: 3 Standard drinks or equivalent per week    Comment: occass   Drug use: No     Allergies   Patient has no known allergies.   Review of Systems Review of Systems  Constitutional:  Negative for chills and  fever.  HENT:  Positive for ear pain and sore throat. Negative for ear discharge.   Respiratory:  Positive for cough. Negative for shortness of breath.   Cardiovascular:  Negative for chest pain and palpitations.  Gastrointestinal:  Negative for abdominal pain, diarrhea and vomiting.  Skin:  Negative for rash.  All other systems reviewed and are negative.    Physical Exam Triage Vital Signs ED Triage Vitals  Enc Vitals Group     BP --      Pulse Rate 11/16/22 0807 64     Resp 11/16/22 0807 18     Temp 11/16/22 0807 98.1 F (36.7 C)     Temp src --      SpO2 11/16/22 0807 97 %     Weight 11/16/22 0812 121 lb (54.9 kg)     Height 11/16/22 0812 5\' 1"  (1.549 m)     Head Circumference --      Peak Flow --      Pain Score 11/16/22 0811 7     Pain Loc --      Pain Edu? --      Excl. in GC? --    No data found.  Updated Vital Signs BP  111/72   Pulse 64   Temp 98.1 F (36.7 C)   Resp 18   Ht 5\' 1"  (1.549 m)   Wt 121 lb (54.9 kg)   SpO2 97%   BMI 22.86 kg/m   Visual Acuity Right Eye Distance:   Left Eye Distance:   Bilateral Distance:    Right Eye Near:   Left Eye Near:    Bilateral Near:     Physical Exam Vitals and nursing note reviewed.  Constitutional:      General: She is not in acute distress.    Appearance: Normal appearance. She is well-developed. She is not ill-appearing.  HENT:     Right Ear: Tympanic membrane is erythematous.     Left Ear: Tympanic membrane normal.     Nose: Nose normal.     Mouth/Throat:     Mouth: Mucous membranes are moist.     Pharynx: Posterior oropharyngeal erythema present.  Eyes:     Conjunctiva/sclera: Conjunctivae normal.  Cardiovascular:     Rate and Rhythm: Normal rate and regular rhythm.     Heart sounds: Normal heart sounds.  Pulmonary:     Effort: Pulmonary effort is normal. No respiratory distress.     Breath sounds: Normal breath sounds.  Musculoskeletal:     Cervical back: Neck supple.  Skin:    General: Skin is warm and dry.  Neurological:     Mental Status: She is alert.  Psychiatric:        Mood and Affect: Mood normal.        Behavior: Behavior normal.      UC Treatments / Results  Labs (all labs ordered are listed, but only abnormal results are displayed) Labs Reviewed - No data to display  EKG   Radiology No results found.  Procedures Procedures (including critical care time)  Medications Ordered in UC Medications - No data to display  Initial Impression / Assessment and Plan / UC Course  I have reviewed the triage vital signs and the nursing notes.  Pertinent labs & imaging results that were available during my care of the patient were reviewed by me and considered in my medical decision making (see chart for details).    Right otitis media, Acute URI.  Treating with  amoxicillin.  Discussed symptomatic treatment including  Tylenol or ibuprofen.  Instructed patient to follow up with PCP if symptoms are not improving.  She agrees to plan of care.   Final Clinical Impressions(s) / UC Diagnoses   Final diagnoses:  Right otitis media, unspecified otitis media type  Acute upper respiratory infection     Discharge Instructions      Take the amoxicillin as directed.  Follow up with your primary care provider if your symptoms are not improving.        ED Prescriptions     Medication Sig Dispense Auth. Provider   amoxicillin (AMOXIL) 875 MG tablet Take 1 tablet (875 mg total) by mouth 2 (two) times daily for 10 days. 20 tablet Mickie Bail, NP      PDMP not reviewed this encounter.   Mickie Bail, NP 11/16/22 0830

## 2022-11-16 NOTE — ED Triage Notes (Signed)
Patient to Urgent Care with complaints of bilateral ear fullness and right sided ear pain/ ringing/ scratchy and dry cough. Muffled hearing. Denies any known fevers.   Symptoms started on Thursday. Has been using saline nasal rinses/ nyquil. Previously otc zyrtec.

## 2022-11-16 NOTE — Discharge Instructions (Addendum)
Take the amoxicillin as directed.  Follow up with your primary care provider if your symptoms are not improving.   ° ° °

## 2022-12-08 ENCOUNTER — Encounter: Payer: Self-pay | Admitting: Dermatology

## 2022-12-08 ENCOUNTER — Ambulatory Visit (INDEPENDENT_AMBULATORY_CARE_PROVIDER_SITE_OTHER): Payer: BC Managed Care – PPO | Admitting: Dermatology

## 2022-12-08 VITALS — BP 92/62 | HR 69

## 2022-12-08 DIAGNOSIS — L900 Lichen sclerosus et atrophicus: Secondary | ICD-10-CM

## 2022-12-08 NOTE — Patient Instructions (Addendum)
Continue Clobetasol ointment. Can increase to 3 days per week.   Continue Analpram twice daily as needed for itching perianal area   Recommend using Zinc Oxide topical to affected areas as needed for irritation.   Topical steroids (such as triamcinolone, fluocinolone, fluocinonide, mometasone, clobetasol, halobetasol, betamethasone, hydrocortisone) can cause thinning and lightening of the skin if they are used for too long in the same area. Your physician has selected the right strength medicine for your problem and area affected on the body. Please use your medication only as directed by your physician to prevent side effects.      Due to recent changes in healthcare laws, you may see results of your pathology and/or laboratory studies on MyChart before the doctors have had a chance to review them. We understand that in some cases there may be results that are confusing or concerning to you. Please understand that not all results are received at the same time and often the doctors may need to interpret multiple results in order to provide you with the best plan of care or course of treatment. Therefore, we ask that you please give [place] 2 business days to thoroughly review all your results before contacting the office for clarification. Should we see a critical lab result, you will be contacted sooner.   If You Need Anything After Your Visit  If you have any questions or concerns for your doctor, please call our main line at [phone] and press option 4 to reach your doctor's medical assistant. If no one answers, please leave a voicemail as directed and we will return your call as soon as possible. Messages left after 4 pm will be answered the following business day.   You may also send [place] a message via MyChart. We typically respond to MyChart messages within 1-2 business days.  For prescription refills, please ask your pharmacy to contact our office. Our fax number is [phone].  If you  have an urgent issue when the clinic is closed that cannot wait until the next business day, you can page your doctor at the number below.    Please note that while we do our best to be available for urgent issues outside of office hours, we are not available 24/7.   If you have an urgent issue and are unable to reach [place], you may choose to seek medical care at your doctor's office, retail clinic, urgent care center, or emergency room.  If you have a medical emergency, please immediately call 911 or go to the emergency department.  Pager Numbers  - Dr. Gwen Pounds: [phone]  - Dr. Neale Burly: [phone]  - Dr. Roseanne Reno: [phone]  In the event of inclement weather, please call our main line at [phone] for an update on the status of any delays or closures.  Dermatology Medication Tips: Please keep the boxes that topical medications come in in order to help keep track of the instructions about where and how to use these. Pharmacies typically print the medication instructions only on the boxes and not directly on the medication tubes.   If your medication is too expensive, please contact our office at [phone] option 4 or send [place] a message through MyChart.   We are unable to tell what your co-pay for medications will be in advance as this is different depending on your insurance coverage. However, we may be able to find a substitute medication at lower cost or fill out paperwork to get insurance to cover a needed medication.  If a prior authorization is required to get your medication covered by your insurance company, please allow [place] 1-2 business days to complete this process.  Drug prices often vary depending on where the prescription is filled and some pharmacies may offer cheaper prices.  The website www.goodrx.com contains coupons for medications through different pharmacies. The prices here do not account for what the cost may be with help from insurance (it may be cheaper  with your insurance), but the website can give you the price if you did not use any insurance.  - You can print the associated coupon and take it with your prescription to the pharmacy.  - You may also stop by our office during regular business hours and pick up a GoodRx coupon card.  - If you need your prescription sent electronically to a different pharmacy, notify our office through [facility] or by phone at [phone] option 4.     Si Usted Necesita Algo Despus de Su Visita  Tambin puede enviarnos un mensaje a travs de Pharmacist, community. Por lo general respondemos a los mensajes de MyChart en el transcurso de 1 a 2 das hbiles.  Para renovar recetas, por favor pida a su farmacia que se ponga en contacto con nuestra oficina. Harland Dingwall de fax es [place] [phone].  Si tiene un asunto urgente cuando la clnica est cerrada y que no puede esperar hasta el siguiente da hbil, puede llamar/localizar a su doctor(a) al nmero que aparece a continuacin.   Por favor, tenga en cuenta que aunque hacemos todo lo posible para estar disponibles para asuntos urgentes fuera del horario de [place], no estamos disponibles las 24 horas del da, los 7 das de la [place].   Si tiene un problema urgente y no puede comunicarse con nosotros, puede optar por buscar atencin mdica  en el consultorio de su doctor(a), en una clnica privada, en un centro de atencin urgente o en una sala de emergencias.  Si tiene Engineering geologist, por favor llame inmediatamente al 911 o vaya a la sala de emergencias.  Nmeros de bper  - Dr. Nehemiah Massed: [phone]  - Dra. Moye: [phone]  - Dra. Nicole Kindred: [phone]  En caso de inclemencias del [place], por favor llame a Johnsie Kindred principal al [phone] para una actualizacin sobre el [place] de cualquier retraso o cierre.  Consejos para la medicacin en dermatologa: Por favor, guarde las cajas en las que vienen los medicamentos de uso tpico para  ayudarle a seguir las instrucciones sobre dnde y cmo usarlos. Las farmacias generalmente imprimen las instrucciones del medicamento slo en las cajas y no directamente en los tubos del [place].   Si su medicamento es muy caro, por favor, pngase en contacto con Zigmund Daniel llamando al [phone] y presione la opcin 4 o envenos un mensaje a travs de Pharmacist, community.   No podemos decirle cul ser su copago por los medicamentos por adelantado ya que esto es diferente dependiendo de la cobertura de su seguro. Sin embargo, es posible que podamos encontrar un medicamento sustituto a Electrical engineer un formulario para que el seguro cubra el medicamento que se considera necesario.   Si se requiere una autorizacin previa para que su compaa de seguros [place] su medicamento, por favor permtanos de 1 a 2 das hbiles para completar este proceso.  Los precios de los medicamentos varan con frecuencia dependiendo del Environmental consultant de dnde se surte la receta y alguna farmacias pueden ofrecer precios ms baratos.  El sitio web www.goodrx.com  tiene cupones para medicamentos de Airline pilot. Los precios aqu no tienen en cuenta lo que podra costar con la ayuda del seguro (puede ser ms barato con su seguro), pero el sitio web puede darle el precio si no utiliz Research scientist (physical sciences).  - Puede imprimir el cupn correspondiente y llevarlo con su receta a la farmacia.  - Tambin puede pasar por nuestra oficina durante el horario de atencin regular y Charity fundraiser una tarjeta de cupones de GoodRx.  - Si necesita que su receta se enve electrnicamente a una farmacia diferente, informe a nuestra oficina a travs de MyChart de [place] o por telfono llamando al [phone] y presione la opcin 4.

## 2022-12-08 NOTE — Progress Notes (Signed)
   Follow-Up Visit   Subjective  Allison Hudson is a 52 y.o. female who presents for the following: 6 month follow up. LSetA. Using Clobetasol ointment twice a week for maintenance. Increases as needed for flares. Uses Analpram-HC at perianal area. States sometimes feels some irritation when wears tight clothes.  Pain and itching has resolved.   The following portions of the chart were reviewed this encounter and updated as appropriate: medications, allergies, medical history  Review of Systems:  No other skin or systemic complaints except as noted in HPI or Assessment and Plan.  Objective  Well appearing patient in no apparent distress; mood and affect are within normal limits.   A focused examination was performed of the following areas: Vaginal/groin area  Relevant exam findings are noted in the Assessment and Plan.    Assessment & Plan    LICHEN SCLEROSUS ET ATROPHICUS Exam:  hypopigmented patch at perianal area. Very mild erythema at vaginal labia minora area. No active areas today.  Chronic condition with duration or expected duration over one year. Currently well-controlled. Some occasional irritation with tight clothing.  Lichen sclerosus is a chronic inflammatory condition of unknown cause that frequently involves the vaginal area and less commonly extragenital skin, and is NOT sexually transmitted. It frequently causes symptoms of pain and burning.  It requires regular monitoring and treatment with topical steroids to minimize inflammation and to reduce risk of scarring. There is also a risk of cancer in the vaginal area which is very low if inflammation is well controlled. Regular checks of the area are recommended. Please call if you notice any new or changing spots within this area.  Treatment Plan: Continue Clobetasol ointment twice weekly. Can increase to 3-5 days per week prn increasing symptoms   Continue Analpram twice daily as needed for itching perianal area    Recommend using Zinc Oxide topical or other skin protectant to affected areas as needed for irritation. Samples given ciclaplast, avene. ceraVe oint  Topical steroids (such as triamcinolone, fluocinolone, fluocinonide, mometasone, clobetasol, halobetasol, betamethasone, hydrocortisone) can cause thinning and lightening of the skin if they are used for too long in the same area. Your physician has selected the right strength medicine for your problem and area affected on the body. Please use your medication only as directed by your physician to prevent side effects.    Return for LSetA follow up in 6-8 months.  I, Lawson Radar, CMA, am acting as scribe for Willeen Niece, MD.   Documentation: I have reviewed the above documentation for accuracy and completeness, and I agree with the above.  Willeen Niece, MD

## 2022-12-14 ENCOUNTER — Ambulatory Visit: Payer: BC Managed Care – PPO | Admitting: Family Medicine

## 2022-12-16 DIAGNOSIS — Z0279 Encounter for issue of other medical certificate: Secondary | ICD-10-CM

## 2022-12-17 ENCOUNTER — Telehealth: Payer: Self-pay

## 2022-12-17 ENCOUNTER — Ambulatory Visit (INDEPENDENT_AMBULATORY_CARE_PROVIDER_SITE_OTHER): Payer: BC Managed Care – PPO | Admitting: Nurse Practitioner

## 2022-12-17 ENCOUNTER — Encounter: Payer: Self-pay | Admitting: Nurse Practitioner

## 2022-12-17 VITALS — BP 116/72 | HR 70 | Temp 98.1°F | Ht 61.0 in | Wt 124.4 lb

## 2022-12-17 DIAGNOSIS — H6692 Otitis media, unspecified, left ear: Secondary | ICD-10-CM | POA: Insufficient documentation

## 2022-12-17 DIAGNOSIS — H9191 Unspecified hearing loss, right ear: Secondary | ICD-10-CM

## 2022-12-17 MED ORDER — AMOXICILLIN-POT CLAVULANATE 875-125 MG PO TABS: 1.0000 | ORAL_TABLET | Freq: Two times a day (BID) | ORAL | 0 refills | Status: DC

## 2022-12-17 MED ORDER — FLUCONAZOLE 150 MG PO TABS: 150.0000 mg | ORAL_TABLET | Freq: Once | ORAL | 0 refills | Status: AC

## 2022-12-17 MED ORDER — NEOMYCIN-POLYMYXIN-HC 1 % OT SOLN: OTIC | 0 refills | Status: DC

## 2022-12-17 NOTE — Progress Notes (Signed)
Established Patient Office Visit  Subjective:  Patient ID: Allison Hudson, female    DOB: November 21, 1970  Age: 52 y.o. MRN: [id number]  CC:  Chief Complaint  Patient presents with   Acute Visit    Ear ache after completing antibiotic on 11/26/22 Feels as if Right ear is full of Fluid but pain is gone    HPI  Langley Adie presents for otalgia and ear fullness.   Otalgia  There is pain in both ears. This is a recurrent problem. The current episode started in the past 7 days. The problem has been unchanged. There has been no fever. Associated symptoms include coughing. Pertinent negatives include no ear discharge, headaches or vomiting. Associated symptoms comments: Hearing loss right ear. She has tried NSAIDs and antibiotics for the symptoms. The treatment provided mild relief.   She states that after completing amoxicillin course on 11/26/22 she felt better but her symptoms came back in  last 7 days.  She also reports some hearing loss on the right ear.  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   Past Medical History:  Diagnosis Date   Depression    Frequent headaches     Past Surgical History:  Procedure Laterality Date   ABDOMINAL HYSTERECTOMY     AUGMENTATION MAMMAPLASTY Bilateral 2014   COLONOSCOPY WITH PROPOFOL N/A 05/06/2020   Procedure: COLONOSCOPY WITH PROPOFOL;  Surgeon: Wyline Mood, MD;  Location: [facility] ENDOSCOPY;  Service: Gastroenterology;  Laterality: N/A;    Family History  Problem Relation Age of Onset   Hypertension Mother    Hyperlipidemia Mother    Breast cancer Neg Hx     Social History   Socioeconomic History   Marital status: Single    Spouse name: Not on file   Number of children: Not on file   Years of education: Not on file   Highest education level: Not on file  Occupational History   Not on file  Tobacco Use   Smoking status: Never   Smokeless tobacco: Never  Vaping Use    Vaping Use: Never used  Substance and Sexual Activity   Alcohol use: Yes    Alcohol/week: 3.0 standard drinks of alcohol    Types: 3 Standard drinks or equivalent per week    Comment: occass   Drug use: No   Sexual activity: Yes    Partners: Male    Birth control/protection: Surgical    Comment: hyster  Other Topics Concern   Not on file  Social History Narrative   Not on file   Social Determinants of Health   Financial Resource Strain: Not on file  Food Insecurity: Not on file  Transportation Needs: Not on file  Physical Activity: Not on file  Stress: Not on file  Social Connections: Not on file  Intimate Partner Violence: Not on file     Outpatient Medications Prior to Visit  Medication Sig Dispense Refill   clobetasol ointment (TEMOVATE) 0.05 % Apply 1 Application topically as directed. Apply to rash in groin 2 times a week for maintenance, if flares may use bid for up to 2 weeks, avoid face, axilla 30 g 4   hydrocortisone-pramoxine (ANALPRAM-HC) 2.5-1 % rectal cream Apply to rash around rectal area BID PRN itching 30 g 4   No facility-administered medications prior to visit.    No Known Allergies  ROS Review of Systems  HENT:  Positive  for ear pain. Negative for ear discharge.   Respiratory:  Positive for cough.   Gastrointestinal:  Negative for vomiting.  Neurological:  Negative for headaches.   Negative unless indicated in HPI.    Objective:    Physical Exam Constitutional:      Appearance: Normal appearance.  HENT:     Head: Normocephalic.     Right Ear: A middle ear effusion is present.     Left Ear: A middle ear effusion is present. Tympanic membrane is erythematous.     Nose: Nose normal.  Cardiovascular:     Rate and Rhythm: Normal rate and regular rhythm.     Pulses: Normal pulses.     Heart sounds: Normal heart sounds.  Pulmonary:     Effort: Pulmonary effort is normal.     Breath sounds: Normal breath sounds. No stridor. No wheezing.   Skin:    General: Skin is warm.  Neurological:     General: No focal deficit present.     Mental Status: She is alert and oriented to person, place, and time. Mental status is at baseline.     Cranial Nerves: No cranial nerve deficit.  Psychiatric:        Mood and Affect: Mood normal.        Behavior: Behavior normal.        Thought Content: Thought content normal.        Judgment: Judgment normal.     BP 116/72   Pulse 70   Temp 98.1 F (36.7 C)   Ht 5\' 1"  (1.549 m)   Wt 124 lb 6.4 oz (56.4 kg)   SpO2 98%   BMI 23.51 kg/m  Wt Readings from Last 3 Encounters:  12/17/22 124 lb 6.4 oz (56.4 kg)  11/16/22 121 lb (54.9 kg)  07/13/22 119 lb 12.8 oz (54.3 kg)     Health Maintenance  Topic Date Due   COVID-19 Vaccine (1) Never done   HIV Screening  Never done   Hepatitis C Screening  Never done   Zoster Vaccines- Shingrix (1 of 2) Never done   DTaP/Tdap/Td (2 - Td or Tdap) 11/13/2013   INFLUENZA VACCINE  01/21/2023   PAP SMEAR-Modifier  04/13/2023   MAMMOGRAM  04/29/2024   Colonoscopy  05/06/2030   HPV VACCINES  Aged Out    There are no preventive care reminders to display for this patient.  Lab Results  Component Value Date   TSH 1.57 06/10/2022   Lab Results  Component Value Date   WBC 5.1 06/10/2022   HGB 14.8 06/10/2022   HCT 43.6 06/10/2022   MCV 95.0 06/10/2022   PLT 256.0 06/10/2022   Lab Results  Component Value Date   NA 141 06/10/2022   K 3.8 06/10/2022   CO2 24 06/10/2022   GLUCOSE 92 06/10/2022   BUN 18 06/10/2022   CREATININE 0.81 06/10/2022   BILITOT 0.5 06/10/2022   ALKPHOS 77 06/10/2022   AST 20 06/10/2022   ALT 19 06/10/2022   PROT 7.4 06/10/2022   ALBUMIN 4.8 06/10/2022   CALCIUM 9.3 06/10/2022   ANIONGAP 6 09/05/2017   GFR 84.50 06/10/2022   Lab Results  Component Value Date   CHOL 207 (H) 06/10/2022   Lab Results  Component Value Date   HDL 70.60 06/10/2022   Lab Results  Component Value Date   LDLCALC 124 (H)  06/10/2022   Lab Results  Component Value Date   TRIG 63.0 06/10/2022   Lab Results  Component Value Date   CHOLHDL 3 06/10/2022   Lab Results  Component Value Date   HGBA1C 5.5 06/10/2022      Assessment & Plan:  Hearing loss of right ear, unspecified hearing loss type Assessment & Plan: Referral sent to ENT for further evaluation.  Orders: -     Ambulatory referral to ENT  Left otitis media, unspecified otitis media type Assessment & Plan: Erythematous TM with mild effusion. Will treat with Augmentin twice a day for 10 days and eardrops.    Other orders -     Amoxicillin-Pot Clavulanate; Take 1 tablet by mouth 2 (two) times daily.  Dispense: 20 tablet; Refill: 0 -     Neomycin-Polymyxin-HC; 4 gtt in affected ear(s) tid, max of 10 days. Lie with affected ear upward x 5 minutes  Dispense: 10 mL; Refill: 0 -     Fluconazole; Take 1 tablet (150 mg total) by mouth once for 1 dose.  Dispense: 1 tablet; Refill: 0    Follow-up: Return if symptoms worsen or fail to improve.   Kara Dies, NP

## 2022-12-17 NOTE — Patient Instructions (Addendum)
Rx sent to pharmacy. Referral sent to ENT.

## 2022-12-17 NOTE — Assessment & Plan Note (Signed)
Erythematous TM with mild effusion. Will treat with Augmentin twice a day for 10 days and eardrops.

## 2022-12-17 NOTE — Telephone Encounter (Signed)
Left message to call the office back regarding her medications.

## 2022-12-17 NOTE — Assessment & Plan Note (Signed)
Referral sent to ENT for further evaluation.  

## 2023-01-12 NOTE — Telephone Encounter (Signed)
Error

## 2023-01-15 ENCOUNTER — Telehealth: Payer: Self-pay | Admitting: Family Medicine

## 2023-01-15 NOTE — Telephone Encounter (Signed)
Patient called and is having ear infection symptoms. She saw Allison Hudson on 12/15/2022, and before that Urgent Care. Allison Hudson did put a referral for ENT. She doesn't see ENT until August. She is going out of town and wanted to know if Allison Hudson would prescribe her another antibiotic.

## 2023-01-17 ENCOUNTER — Encounter: Payer: Self-pay | Admitting: Nurse Practitioner

## 2023-01-17 ENCOUNTER — Other Ambulatory Visit: Payer: Self-pay | Admitting: Nurse Practitioner

## 2023-01-17 MED ORDER — AZITHROMYCIN 250 MG PO TABS: ORAL_TABLET | ORAL | 0 refills | Status: AC

## 2023-02-15 DIAGNOSIS — H6981 Other specified disorders of Eustachian tube, right ear: Secondary | ICD-10-CM | POA: Diagnosis not present

## 2023-02-15 DIAGNOSIS — H9042 Sensorineural hearing loss, unilateral, left ear, with unrestricted hearing on the contralateral side: Secondary | ICD-10-CM | POA: Diagnosis not present

## 2023-04-06 ENCOUNTER — Encounter: Payer: Self-pay | Admitting: Nurse Practitioner

## 2023-04-06 ENCOUNTER — Ambulatory Visit: Payer: BC Managed Care – PPO | Admitting: Nurse Practitioner

## 2023-04-06 ENCOUNTER — Telehealth: Payer: Self-pay | Admitting: Family Medicine

## 2023-04-06 VITALS — BP 112/82 | HR 71 | Temp 97.5°F | Ht 61.5 in | Wt 127.0 lb

## 2023-04-06 DIAGNOSIS — R42 Dizziness and giddiness: Secondary | ICD-10-CM | POA: Diagnosis not present

## 2023-04-06 NOTE — Telephone Encounter (Signed)
Pt would like to be called regarding an incident that happen this weekend that she never experienced

## 2023-04-06 NOTE — Assessment & Plan Note (Addendum)
Pt has positive romberg test, Normal finger to nose. Some frontal HD.  Denise any numbness or tingling at present. Pt declined to go to the ER.  Will check labs. Stat CT of head ordered for further evaluation.  Red flag discussed.  Patient was provided clear instructions to go to ER or urgent care if symptoms does not improve, red flag or new problem develops.  Patient verbalized understanding.

## 2023-04-06 NOTE — Telephone Encounter (Signed)
Called pt she reported that she woke up at 4am this morning experiencing the following symptoms: dizziness, nausea, poor balance, numbness in fingers.  Period lasted until 9am.  She stayed home from work.  Pt has never felt this way before.  Has improved some but still having some dizziness.  Scheduled to see Nelta Numbers at 3:40p.

## 2023-04-06 NOTE — Progress Notes (Signed)
Established Patient Office Visit  Subjective:  Patient ID: Allison Hudson, female    DOB: [birth year]  Age: 52 y.o. MRN: [id number]  CC:  Chief Complaint  Patient presents with   Dizziness    HPI  Allison Hudson presents for  Dizziness This is a new problem. The current episode started today. Associated symptoms include headaches, numbness and a visual change. Pertinent negatives include no chest pain or vertigo. Associated symptoms comments: Nausea, loss of balance, and had fall but did not hit her head.. Nothing aggravates the symptoms. She has tried nothing for the symptoms.   She had never felt like this before.  She states that the dizziness, numbness and tingling  has resolved but still have some headache.   Past Medical History:  Diagnosis Date   Depression    Frequent headaches     Past Surgical History:  Procedure Laterality Date   ABDOMINAL HYSTERECTOMY     AUGMENTATION MAMMAPLASTY Bilateral 2014   COLONOSCOPY WITH PROPOFOL N/A 05/06/2020   Procedure: COLONOSCOPY WITH PROPOFOL;  Surgeon: Wyline Mood, MD;  Location: [facility] ENDOSCOPY;  Service: Gastroenterology;  Laterality: N/A;    Family History  Problem Relation Age of Onset   Hypertension Mother    Hyperlipidemia Mother    Breast cancer Neg Hx     Social History   Socioeconomic History   Marital status: Single    Spouse name: Not on file   Number of children: Not on file   Years of education: Not on file   Highest education level: Not on file  Occupational History   Not on file  Tobacco Use   Smoking status: Never   Smokeless tobacco: Never  Vaping Use   Vaping status: Never Used  Substance and Sexual Activity   Alcohol use: Yes    Alcohol/week: 3.0 standard drinks of alcohol    Types: 3 Standard drinks or equivalent per week    Comment: occass   Drug use: No   Sexual activity: Yes    Partners: Male    Birth control/protection: Surgical    Comment: hyster  Other Topics Concern   Not on  file  Social History Narrative   Not on file   Social Determinants of Health   Financial Resource Strain: Not on file  Food Insecurity: Not on file  Transportation Needs: Not on file  Physical Activity: Not on file  Stress: Not on file  Social Connections: Not on file  Intimate Partner Violence: Not on file     Outpatient Medications Prior to Visit  Medication Sig Dispense Refill   amoxicillin-clavulanate (AUGMENTIN) 875-125 MG tablet Take 1 tablet by mouth 2 (two) times daily. (Patient not taking: Reported on 04/06/2023) 20 tablet 0   clobetasol ointment (TEMOVATE) 0.05 % Apply 1 Application topically as directed. Apply to rash in groin 2 times a week for maintenance, if flares may use bid for up to 2 weeks, avoid face, axilla (Patient not taking: Reported on 04/06/2023) 30 g 4   hydrocortisone-pramoxine (ANALPRAM-HC) 2.5-1 % rectal cream Apply to rash around rectal area BID PRN itching 30 g 4   NEOMYCIN-POLYMYXIN-HYDROCORTISONE (CORTISPORIN) 1 % SOLN OTIC solution 4 gtt in affected ear(s) tid, max of 10 days. Lie with affected ear upward x 5 minutes 10 mL 0   No facility-administered medications prior to visit.    No Known Allergies  ROS Review of Systems  Cardiovascular:  Negative for chest pain.  Neurological:  Positive for dizziness, numbness and headaches.  Negative for vertigo.   Negative unless indicated in HPI.    Objective:    Physical Exam Constitutional:      Appearance: Normal appearance.  HENT:     Head: Normocephalic.     Right Ear: A middle ear effusion is present.     Left Ear: A middle ear effusion is present.     Nose: Nose normal.     Mouth/Throat:     Mouth: Mucous membranes are moist.  Eyes:     Extraocular Movements: Extraocular movements intact.     Conjunctiva/sclera: Conjunctivae normal.     Pupils: Pupils are equal, round, and reactive to light.  Cardiovascular:     Pulses: Normal pulses.     Heart sounds: Normal heart sounds.   Pulmonary:     Effort: Pulmonary effort is normal. No respiratory distress.     Breath sounds: Normal breath sounds. No rhonchi.  Neurological:     Mental Status: She is alert and oriented to person, place, and time.     Cranial Nerves: No dysarthria or facial asymmetry.     Sensory: Sensation is intact.     Motor: No weakness.     Coordination: Romberg sign positive. Finger-Nose-Finger Test normal.     Gait: Gait is intact.     BP 112/82   Pulse 71   Temp (!) 97.5 F (36.4 C) (Oral)   Ht 5' 1.5" (1.562 m)   Wt 127 lb (57.6 kg)   SpO2 96%   BMI 23.61 kg/m  Wt Readings from Last 3 Encounters:  04/06/23 127 lb (57.6 kg)  12/17/22 124 lb 6.4 oz (56.4 kg)  11/16/22 121 lb (54.9 kg)     Health Maintenance  Topic Date Due   COVID-19 Vaccine (1) Never done   HIV Screening  Never done   Hepatitis C Screening  Never done   Zoster Vaccines- Shingrix (1 of 2) Never done   DTaP/Tdap/Td (2 - Td or Tdap) 11/13/2013   INFLUENZA VACCINE  09/20/2023 (Originally 01/21/2023)   MAMMOGRAM  04/29/2024   Cervical Cancer Screening (HPV/Pap Cotest)  04/12/2025   Colonoscopy  05/06/2030   HPV VACCINES  Aged Out    There are no preventive care reminders to display for this patient.  Lab Results  Component Value Date   TSH 1.57 06/10/2022   Lab Results  Component Value Date   WBC 5.1 06/10/2022   HGB 14.8 06/10/2022   HCT 43.6 06/10/2022   MCV 95.0 06/10/2022   PLT 256.0 06/10/2022   Lab Results  Component Value Date   NA 141 06/10/2022   K 3.8 06/10/2022   CO2 24 06/10/2022   GLUCOSE 92 06/10/2022   BUN 18 06/10/2022   CREATININE 0.81 06/10/2022   BILITOT 0.5 06/10/2022   ALKPHOS 77 06/10/2022   AST 20 06/10/2022   ALT 19 06/10/2022   PROT 7.4 06/10/2022   ALBUMIN 4.8 06/10/2022   CALCIUM 9.3 06/10/2022   ANIONGAP 6 09/05/2017   GFR 84.50 06/10/2022   Lab Results  Component Value Date   CHOL 207 (H) 06/10/2022   Lab Results  Component Value Date   HDL 70.60  06/10/2022   Lab Results  Component Value Date   LDLCALC 124 (H) 06/10/2022   Lab Results  Component Value Date   TRIG 63.0 06/10/2022   Lab Results  Component Value Date   CHOLHDL 3 06/10/2022   Lab Results  Component Value Date   HGBA1C 5.5 06/10/2022  Assessment & Plan:  Dizziness Assessment & Plan: Pt has positive romberg test, Normal finger to nose. Some frontal HD.  Denise any numbness or tingling at present. Pt declined to go to the ER.  Will check labs. Stat CT of head ordered for further evaluation.  Red flag discussed.  Patient was provided clear instructions to go to ER or urgent care if symptoms does not improve, red flag or new problem develops.  Patient verbalized understanding.     Orders: -     CBC -     Comprehensive metabolic panel -     CT HEAD WO CONTRAST ( ); Future    Follow-up: No follow-ups on file.   Kara Dies, NP

## 2023-04-07 ENCOUNTER — Ambulatory Visit
Admission: RE | Admit: 2023-04-07 | Discharge: 2023-04-07 | Disposition: A | Discharge status: Home / Self Care | Payer: BC Managed Care – PPO | Source: Ambulatory Visit | Attending: Nurse Practitioner | Admitting: Nurse Practitioner

## 2023-04-07 ENCOUNTER — Telehealth: Payer: Self-pay

## 2023-04-07 DIAGNOSIS — R42 Dizziness and giddiness: Secondary | ICD-10-CM | POA: Insufficient documentation

## 2023-04-07 LAB — COMPREHENSIVE METABOLIC PANEL
ALT: 17 U/L (ref 0–35)
AST: 23 U/L (ref 0–37)
Albumin: 4.3 g/dL (ref 3.5–5.2)
Alkaline Phosphatase: 71 U/L (ref 39–117)
BUN: 20 mg/dL (ref 6–23)
CO2: 28 meq/L (ref 19–32)
Calcium: 9 mg/dL (ref 8.4–10.5)
Chloride: 104 meq/L (ref 96–112)
Creatinine, Ser: 0.81 mg/dL (ref 0.40–1.20)
GFR: 84.01 mL/min (ref >60.00)
Glucose, Bld: 70 mg/dL (ref 70–99)
Potassium: 4.1 meq/L (ref 3.5–5.1)
Sodium: 139 meq/L (ref 135–145)
Total Bilirubin: 0.2 mg/dL (ref 0.2–1.2)
Total Protein: 7.1 g/dL (ref 6.0–8.3)

## 2023-04-07 LAB — CBC
HCT: 42.3 % (ref 36.0–46.0)
Hemoglobin: 13.8 g/dL (ref 12.0–15.0)
MCHC: 32.7 g/dL (ref 30.0–36.0)
MCV: 96.1 fL (ref 78.0–100.0)
Platelets: 261 10*3/uL (ref 150.0–400.0)
RBC: 4.41 Mil/uL (ref 3.87–5.11)
RDW: 13.8 % (ref 11.5–15.5)
WBC: 7.8 10*3/uL (ref 4.0–10.5)

## 2023-04-07 NOTE — Telephone Encounter (Signed)
Lvm for pt to give office a call back in regards to ct results

## 2023-04-07 NOTE — Progress Notes (Signed)
Please inform the patient the CT is normal. No acute findings. How is she feeling today?

## 2023-04-07 NOTE — Telephone Encounter (Signed)
-----   Message from Allison Hudson sent at 04/07/2023 11:15 AM EDT ----- Please inform the patient the CT is normal. No acute findings. How is she feeling today?

## 2023-04-07 NOTE — Telephone Encounter (Signed)
Patient seen by NP yesterday.

## 2023-04-11 NOTE — Progress Notes (Signed)
Pt schedule to see Dr. Birdie Sons on 10/23

## 2023-04-14 ENCOUNTER — Ambulatory Visit: Payer: BC Managed Care – PPO | Admitting: Family Medicine

## 2023-06-08 ENCOUNTER — Ambulatory Visit: Payer: BC Managed Care – PPO | Admitting: Dermatology

## 2023-06-08 ENCOUNTER — Ambulatory Visit: Payer: BC Managed Care – PPO | Admitting: Family Medicine

## 2023-06-08 ENCOUNTER — Encounter: Payer: Self-pay | Admitting: Dermatology

## 2023-06-08 ENCOUNTER — Other Ambulatory Visit: Payer: Self-pay | Admitting: Family Medicine

## 2023-06-08 ENCOUNTER — Encounter: Payer: Self-pay | Admitting: Family Medicine

## 2023-06-08 VITALS — BP 114/72 | HR 77 | Temp 97.8°F | Ht 61.5 in | Wt 127.4 lb

## 2023-06-08 DIAGNOSIS — L900 Lichen sclerosus et atrophicus: Secondary | ICD-10-CM

## 2023-06-08 DIAGNOSIS — Z7189 Other specified counseling: Secondary | ICD-10-CM | POA: Diagnosis not present

## 2023-06-08 DIAGNOSIS — N390 Urinary tract infection, site not specified: Secondary | ICD-10-CM | POA: Insufficient documentation

## 2023-06-08 DIAGNOSIS — N3090 Cystitis, unspecified without hematuria: Secondary | ICD-10-CM

## 2023-06-08 DIAGNOSIS — N3001 Acute cystitis with hematuria: Secondary | ICD-10-CM

## 2023-06-08 LAB — URINALYSIS, MICROSCOPIC ONLY: RBC / HPF: NONE SEEN (ref 0–?)

## 2023-06-08 LAB — POCT URINALYSIS DIPSTICK
Bilirubin, UA: NEGATIVE
Blood, UA: NEGATIVE
Glucose, UA: NEGATIVE
Ketones, UA: NEGATIVE
Leukocytes, UA: NEGATIVE
Nitrite, UA: NEGATIVE
Protein, UA: NEGATIVE
Spec Grav, UA: <=1.005 — AB (ref 1.010–1.025)
Urobilinogen, UA: 0.2 U/dL
pH, UA: 5 (ref 5.0–8.0)

## 2023-06-08 MED ORDER — FLUCONAZOLE 150 MG PO TABS
150.0000 mg | ORAL_TABLET | ORAL | 0 refills | Status: AC
Start: 2023-06-08 — End: 2023-06-12

## 2023-06-08 MED ORDER — NITROFURANTOIN MONOHYD MACRO 100 MG PO CAPS
100.0000 mg | ORAL_CAPSULE | Freq: Two times a day (BID) | ORAL | 0 refills | Status: DC
Start: 2023-06-08 — End: 2023-07-16

## 2023-06-08 NOTE — Patient Instructions (Addendum)
Apply Vaseline Jelly or Aquaphor each time after urinating.     LSetA Continue Clobetasol ointment twice weekly. Can increase to 3-5 days per week prn increasing symptoms    Continue Analpram twice daily as needed for itching perianal area   Recommend using Zinc Oxide topical or other skin protectant to affected areas as needed for irritation.   Topical steroids (such as triamcinolone, fluocinolone, fluocinonide, mometasone, clobetasol, halobetasol, betamethasone, hydrocortisone) can cause thinning and lightening of the skin if they are used for too long in the same area. Your physician has selected the right strength medicine for your problem and area affected on the body. Please use your medication only as directed by your physician to prevent side effects.    Due to recent changes in healthcare laws, you may see results of your pathology and/or laboratory studies on MyChart before the doctors have had a chance to review them. We understand that in some cases there may be results that are confusing or concerning to you. Please understand that not all results are received at the same time and often the doctors may need to interpret multiple results in order to provide you with the best plan of care or course of treatment. Therefore, we ask that you please give [place] 2 business days to thoroughly review all your results before contacting the office for clarification. Should we see a critical lab result, you will be contacted sooner.   If You Need Anything After Your Visit  If you have any questions or concerns for your doctor, please call our main line at [phone] and press option 4 to reach your doctor's medical assistant. If no one answers, please leave a voicemail as directed and we will return your call as soon as possible. Messages left after 4 pm will be answered the following business day.   You may also send [place] a message via MyChart. We typically respond to MyChart messages within 1-2  business days.  For prescription refills, please ask your pharmacy to contact our office. Our fax number is [phone].  If you have an urgent issue when the clinic is closed that cannot wait until the next business day, you can page your doctor at the number below.    Please note that while we do our best to be available for urgent issues outside of office hours, we are not available 24/7.   If you have an urgent issue and are unable to reach [place], you may choose to seek medical care at your doctor's office, retail clinic, urgent care center, or emergency room.  If you have a medical emergency, please immediately call 911 or go to the emergency department.  Pager Numbers  - Dr. Gwen Pounds: [phone]  - Dr. Roseanne Reno: [phone]  - Dr. Katrinka Blazing: [phone]   In the event of inclement weather, please call our main line at [phone] for an update on the status of any delays or closures.  Dermatology Medication Tips: Please keep the boxes that topical medications come in in order to help keep track of the instructions about where and how to use these. Pharmacies typically print the medication instructions only on the boxes and not directly on the medication tubes.   If your medication is too expensive, please contact our office at [phone] option 4 or send [place] a message through MyChart.   We are unable to tell what your co-pay for medications will be in advance as this is different depending on your insurance coverage. However, we may be  able to find a substitute medication at lower cost or fill out paperwork to get insurance to cover a needed medication.   If a prior authorization is required to get your medication covered by your insurance company, please allow [place] 1-2 business days to complete this process.  Drug prices often vary depending on where the prescription is filled and some pharmacies may offer cheaper prices.  The website www.goodrx.com contains coupons for  medications through different pharmacies. The prices here do not account for what the cost may be with help from insurance (it may be cheaper with your insurance), but the website can give you the price if you did not use any insurance.  - You can print the associated coupon and take it with your prescription to the pharmacy.  - You may also stop by our office during regular business hours and pick up a GoodRx coupon card.  - If you need your prescription sent electronically to a different pharmacy, notify our office through [facility] or by phone at [phone] option 4.     Si Usted Necesita Algo Despus de Su Visita  Tambin puede enviarnos un mensaje a travs de Clinical cytogeneticist. Por lo general respondemos a los mensajes de MyChart en el transcurso de 1 a 2 das hbiles.  Para renovar recetas, por favor pida a su farmacia que se ponga en contacto con nuestra oficina. Annie Sable de fax es [place] [phone].  Si tiene un asunto urgente cuando la clnica est cerrada y que no puede esperar hasta el siguiente da hbil, puede llamar/localizar a su doctor(a) al nmero que aparece a continuacin.   Por favor, tenga en cuenta que aunque hacemos todo lo posible para estar disponibles para asuntos urgentes fuera del horario de [place], no estamos disponibles las 24 horas del da, los 7 [address]  [address] de la [place].   Si tiene un problema urgente y no puede comunicarse con nosotros, puede optar por buscar atencin mdica  en el consultorio de su doctor(a), en una clnica privada, en un centro de atencin urgente o en una sala de emergencias.  Si tiene Engineer, drilling, por favor llame inmediatamente al 911 o vaya a la sala de emergencias.  Nmeros de bper  - Dr. Gwen Pounds: [phone]  - Dra. Roseanne Reno: [phone]  - Dr. Katrinka Blazing: [phone]   En caso de inclemencias del tiempo, por favor llame a Lacy Duverney principal al [phone] para una actualizacin sobre el [place] de cualquier retraso  o cierre.  Consejos para la medicacin en dermatologa: Por favor, guarde las cajas en las que vienen los medicamentos de uso tpico para ayudarle a seguir las instrucciones sobre dnde y cmo usarlos. Las farmacias generalmente imprimen las instrucciones del medicamento slo en las cajas y no directamente en los tubos del [place].   Si su medicamento es muy caro, por favor, pngase en contacto con Rolm Gala llamando al [phone] y presione la opcin 4 o envenos un mensaje a travs de Clinical cytogeneticist.   No podemos decirle cul ser su copago por los medicamentos por adelantado ya que esto es diferente dependiendo de la cobertura de su seguro. Sin embargo, es posible que podamos encontrar un medicamento sustituto a Audiological scientist un formulario para que el seguro cubra el medicamento que se considera necesario.   Si se requiere una autorizacin previa para que su compaa de seguros [place] su medicamento, por favor permtanos de 1 a 2 das hbiles para completar [address]  Los precios de los medicamentos varan  con frecuencia dependiendo del lugar de dnde se surte la receta y alguna farmacias pueden ofrecer precios ms baratos.  El sitio web www.goodrx.com tiene cupones para medicamentos de Health and safety inspector. Los precios aqu no tienen en cuenta lo que podra costar con la ayuda del seguro (puede ser ms barato con su seguro), pero el sitio web puede darle el precio si no utiliz Tourist information centre manager.  - Puede imprimir el cupn correspondiente y llevarlo con su receta a la farmacia.  - Tambin puede pasar por nuestra oficina durante el horario de atencin regular y Education officer, museum una tarjeta de cupones de GoodRx.  - Si necesita que su receta se enve electrnicamente a una farmacia diferente, informe a nuestra oficina a travs de MyChart de [place] o por telfono llamando al [phone] y presione la opcin 4.

## 2023-06-08 NOTE — Assessment & Plan Note (Addendum)
Concern for UTI based on history.  We will send urine for culture and micro.  Treating with Macrobid 100 mg twice daily for 7 days given that this seems to have made a difference in her symptoms.  Will contact her with culture and micro results.  Diflucan sent in for her to take if needed for yeast infection symptoms.

## 2023-06-08 NOTE — Progress Notes (Signed)
   Follow-Up Visit   Subjective  Allison Hudson is a 52 y.o. female who presents for the following: 6 month follow up. LSetA. Using Clobetasol ointment twice a week for maintenance. Increases as needed for flares. Uses Analpram-HC at perianal area. States sometimes feels some irritation when wears tight clothes.  Was seen at PCP today. Area flaring since last Tuesday or Wednesday. States urine had a foul odor. PCP did U/A. And prescribed Macrobid 100 mg capsule. Area has been itching and painful. Has taken fluconazole thinking it was a yeast infection. Painful to sit, painful to stand. Has discharge, has seen blood in urine.  Has lower abdominal pain and lower back pain. Thinks could be flaring due to UTI.    The following portions of the chart were reviewed this encounter and updated as appropriate: medications, allergies, medical history  Review of Systems:  No other skin or systemic complaints except as noted in HPI or Assessment and Plan.  Objective  Well appearing patient in no apparent distress; mood and affect are within normal limits.   A focused examination was performed of the following areas: Vaginal/groin area  Relevant exam findings are noted in the Assessment and Plan.     Assessment & Plan    LICHEN SCLEROSUS ET ATROPHICUS Exam:  does not appear to be flaring today, no erosions, no pink/white patches, no purpura, no scarring, mild pinkness at superior aspect of vaginal area around urethra opening.   Chronic and persistent condition with duration or expected duration over one year. Condition is improving with treatment but not currently at goal.  LS&A flare does not look to be the cause of new symptoms, more likely due to UTI. Skin changes more likely secondary to irritation from increased frequency urination  Lichen sclerosus is a chronic inflammatory condition of unknown cause that frequently involves the vaginal area and less commonly extragenital skin, and is NOT  sexually transmitted. It frequently causes symptoms of pain and burning.  It requires regular monitoring and treatment with topical steroids to minimize inflammation and to reduce risk of scarring. There is also a risk of cancer in the vaginal area which is very low if inflammation is well controlled. Regular checks of the area are recommended. Please call if you notice any new or changing spots within this area.  Reviewed notes and U/A from visit with PCP today.   Treatment Plan: Continue Clobetasol ointment twice weekly. Can increase to 3-5 days per week prn increasing symptoms    Continue Analpram twice daily as needed for itching perianal area   Recommend using Zinc Oxide topical or other skin protectant like vaseline ointment to affected areas as needed for irritation.  Apply Vaseline Jelly or Aquaphor each time after urinating.   Topical steroids (such as triamcinolone, fluocinolone, fluocinonide, mometasone, clobetasol, halobetasol, betamethasone, hydrocortisone) can cause thinning and lightening of the skin if they are used for too long in the same area. Your physician has selected the right strength medicine for your problem and area affected on the body. Please use your medication only as directed by your physician to prevent side effects.     Return in about 6 months (around 12/07/2023) for LSetA follow up.  I, Lawson Radar, CMA, am acting as scribe for Willeen Niece, MD.   Documentation: I have reviewed the above documentation for accuracy and completeness, and I agree with the above.  Willeen Niece, MD

## 2023-06-08 NOTE — Progress Notes (Signed)
Marikay Alar, MD Phone: [phone]  Allison Hudson is a 52 y.o. female who presents today for same day visit.   Urinary frequency: Patient notes onset over the last week or so.  Had foul smell of her urine as well.  Notes urinary urgency.  Notes not much urine comes out when she does have to pee.  Possibly with some blood in her urine last week.  Some lower abdominal discomfort.  Some back pain.  Minimal vaginal discharge that she thinks may be related to yeast infection.  She notes she is not sexually active.  She took Macrobid 1 pill twice daily x 4 doses and notes that has eased her symptoms off.  Social History   Tobacco Use  Smoking Status Never  Smokeless Tobacco Never    Current Outpatient Medications on File Prior to Visit  Medication Sig Dispense Refill   hydrocortisone-pramoxine (ANALPRAM-HC) 2.5-1 % rectal cream Apply to rash around rectal area BID PRN itching 30 g 4   NEOMYCIN-POLYMYXIN-HYDROCORTISONE (CORTISPORIN) 1 % SOLN OTIC solution 4 gtt in affected ear(s) tid, max of 10 days. Lie with affected ear upward x 5 minutes 10 mL 0   No current facility-administered medications on file prior to visit.     ROS see history of present illness  Objective  Physical Exam Vitals:   06/08/23 0814  BP: 114/72  Pulse: 77  Temp: 97.8 F (36.6 C)  SpO2: 99%    BP Readings from Last 3 Encounters:  06/08/23 114/72  04/06/23 112/82  12/17/22 116/72   Wt Readings from Last 3 Encounters:  06/08/23 127 lb 6.4 oz (57.8 kg)  04/06/23 127 lb (57.6 kg)  12/17/22 124 lb 6.4 oz (56.4 kg)    Physical Exam Constitutional:      General: She is not in acute distress.    Appearance: She is not diaphoretic.  Cardiovascular:     Rate and Rhythm: Normal rate and regular rhythm.     Heart sounds: Normal heart sounds.  Pulmonary:     Effort: Pulmonary effort is normal.     Breath sounds: Normal breath sounds.  Abdominal:     General: Bowel sounds are normal. There is no  distension.     Palpations: Abdomen is soft.     Tenderness: There is abdominal tenderness (Suprapubic). There is no right CVA tenderness or left CVA tenderness.  Skin:    General: Skin is warm and dry.  Neurological:     Mental Status: She is alert.      Assessment/Plan: Please see individual problem list.  Acute cystitis with hematuria Assessment & Plan: Concern for UTI based on history.  We will send urine for culture and micro.  Treating with Macrobid 100 mg twice daily for 7 days given that this seems to have made a difference in her symptoms.  Will contact her with culture and micro results.  Diflucan sent in for her to take if needed for yeast infection symptoms.  Orders: -     Nitrofurantoin Monohyd Macro; Take 1 capsule (100 mg total) by mouth 2 (two) times daily.  Dispense: 14 capsule; Refill: 0 -     Fluconazole; Take 1 tablet (150 mg total) by mouth every 3 (three) days for 2 doses.  Dispense: 2 tablet; Refill: 0 -     Urine Microscopic -     Urine Culture -     POCT urinalysis dipstick; Future    Return in about 6 weeks (around 07/20/2023) for urine recheck  with lab.   Marikay Alar, MD [facility]

## 2023-06-10 LAB — URINE CULTURE
MICRO NUMBER:: 15860710
Result:: NO GROWTH
SPECIMEN QUALITY:: ADEQUATE

## 2023-06-11 ENCOUNTER — Telehealth: Payer: Self-pay | Admitting: Family Medicine

## 2023-06-11 NOTE — Telephone Encounter (Signed)
I sent the letter through to her MyChart.  If she needs it printed out you can do that and she can pick it up from the front.

## 2023-06-11 NOTE — Telephone Encounter (Signed)
Copied from CRM [id number]. Topic: General - Other >> Jun 11, 2023 12:28 PM Danika B wrote: Reason for CRM: Patient states she forgot to request doctors note at her last visit for her employer. Wants to know what protocol is for getting one. States she was previously told they can't be emailed but not sure if there is another way. She is willing to stop by today to pick it up if someone calls her once it is ready. Callback [phone]

## 2023-06-21 ENCOUNTER — Encounter: Payer: Self-pay | Admitting: Family Medicine

## 2023-07-07 ENCOUNTER — Telehealth: Payer: Self-pay

## 2023-07-07 NOTE — Telephone Encounter (Signed)
 Copied from CRM [id number]. Topic: Clinical - Request for Lab/Test Order >> Jul 07, 2023  4:24 PM [name] wrote: Reason for CRM: Patient would like to speak to a nurse in regards to ordering a EKG test, she has additional questions.

## 2023-07-09 NOTE — Telephone Encounter (Signed)
 Noted. Will discuss at upcoming visit

## 2023-07-09 NOTE — Telephone Encounter (Signed)
Pt is scheduled for a CPE on 07/16/23. She wanted to make [place] aware that she will need some labs and a EKG performed at her visit for surgical clearance to have implants removed.   Pt aware to bring any forms in for clearance at that time as well.

## 2023-07-14 ENCOUNTER — Telehealth: Payer: Self-pay

## 2023-07-14 DIAGNOSIS — Z1329 Encounter for screening for other suspected endocrine disorder: Secondary | ICD-10-CM

## 2023-07-14 DIAGNOSIS — Z131 Encounter for screening for diabetes mellitus: Secondary | ICD-10-CM

## 2023-07-14 DIAGNOSIS — Z13220 Encounter for screening for lipoid disorders: Secondary | ICD-10-CM

## 2023-07-14 DIAGNOSIS — Z130 Encounter for screening for diseases of the blood and blood-forming organs and certain disorders involving the immune mechanism: Secondary | ICD-10-CM

## 2023-07-14 NOTE — Addendum Note (Signed)
Addended by: Glori Luis on: 07/14/2023 02:50 PM   Modules accepted: Orders

## 2023-07-14 NOTE — Telephone Encounter (Signed)
Copied from CRM [id number]. Topic: Clinical - Lab/Test Results >> Jul 14, 2023  1:26 PM Allison Hudson wrote: Reason for CRM: Patient is coming to the lab to get fasting labs for her physical 07/15/23 her physical is 07/16/23. Patient is requesting the provider order the labs so they are in the system for when she arrives.

## 2023-07-14 NOTE — Telephone Encounter (Signed)
Labs ordered.

## 2023-07-15 ENCOUNTER — Other Ambulatory Visit (INDEPENDENT_AMBULATORY_CARE_PROVIDER_SITE_OTHER): Payer: BC Managed Care – PPO

## 2023-07-15 DIAGNOSIS — Z131 Encounter for screening for diabetes mellitus: Secondary | ICD-10-CM

## 2023-07-15 DIAGNOSIS — Z13220 Encounter for screening for lipoid disorders: Secondary | ICD-10-CM

## 2023-07-15 DIAGNOSIS — Z1329 Encounter for screening for other suspected endocrine disorder: Secondary | ICD-10-CM | POA: Diagnosis not present

## 2023-07-15 DIAGNOSIS — Z130 Encounter for screening for diseases of the blood and blood-forming organs and certain disorders involving the immune mechanism: Secondary | ICD-10-CM

## 2023-07-15 LAB — LIPID PANEL
Cholesterol: 199 mg/dL (ref 0–200)
HDL: 79.3 mg/dL (ref >39.00)
LDL Cholesterol: 107 mg/dL — ABNORMAL HIGH (ref 0–99)
NonHDL: 119.52
Total CHOL/HDL Ratio: 3
Triglycerides: 62 mg/dL (ref 0.0–149.0)
VLDL: 12.4 mg/dL (ref 0.0–40.0)

## 2023-07-15 LAB — COMPREHENSIVE METABOLIC PANEL
ALT: 22 U/L (ref 0–35)
AST: 24 U/L (ref 0–37)
Albumin: 4.7 g/dL (ref 3.5–5.2)
Alkaline Phosphatase: 62 U/L (ref 39–117)
BUN: 17 mg/dL (ref 6–23)
CO2: 26 meq/L (ref 19–32)
Calcium: 8.9 mg/dL (ref 8.4–10.5)
Chloride: 102 meq/L (ref 96–112)
Creatinine, Ser: 0.72 mg/dL (ref 0.40–1.20)
GFR: 96.58 mL/min (ref >60.00)
Glucose, Bld: 97 mg/dL (ref 70–99)
Potassium: 3.9 meq/L (ref 3.5–5.1)
Sodium: 138 meq/L (ref 135–145)
Total Bilirubin: 0.5 mg/dL (ref 0.2–1.2)
Total Protein: 7.4 g/dL (ref 6.0–8.3)

## 2023-07-15 LAB — CBC
HCT: 40.7 % (ref 36.0–46.0)
Hemoglobin: 13.5 g/dL (ref 12.0–15.0)
MCHC: 33.3 g/dL (ref 30.0–36.0)
MCV: 95.4 fL (ref 78.0–100.0)
Platelets: 236 10*3/uL (ref 150.0–400.0)
RBC: 4.27 Mil/uL (ref 3.87–5.11)
RDW: 14.1 % (ref 11.5–15.5)
WBC: 5.8 10*3/uL (ref 4.0–10.5)

## 2023-07-15 LAB — TSH: TSH: 1.22 u[IU]/mL (ref 0.35–5.50)

## 2023-07-15 LAB — HEMOGLOBIN A1C: Hgb A1c MFr Bld: 5.6 % (ref 4.6–6.5)

## 2023-07-16 ENCOUNTER — Ambulatory Visit (INDEPENDENT_AMBULATORY_CARE_PROVIDER_SITE_OTHER): Payer: BC Managed Care – PPO | Admitting: Family Medicine

## 2023-07-16 ENCOUNTER — Encounter: Payer: Self-pay | Admitting: Family Medicine

## 2023-07-16 VITALS — BP 98/72 | HR 57 | Temp 98.2°F | Resp 18 | Ht 61.5 in | Wt 128.4 lb

## 2023-07-16 DIAGNOSIS — Z01818 Encounter for other preprocedural examination: Secondary | ICD-10-CM | POA: Diagnosis not present

## 2023-07-16 DIAGNOSIS — R8290 Unspecified abnormal findings in urine: Secondary | ICD-10-CM | POA: Diagnosis not present

## 2023-07-16 DIAGNOSIS — R350 Frequency of micturition: Secondary | ICD-10-CM | POA: Diagnosis not present

## 2023-07-16 DIAGNOSIS — Z0001 Encounter for general adult medical examination with abnormal findings: Secondary | ICD-10-CM

## 2023-07-16 LAB — POC URINALSYSI DIPSTICK (AUTOMATED)
Bilirubin, UA: NEGATIVE
Glucose, UA: NEGATIVE
Ketones, UA: NEGATIVE
Nitrite, UA: POSITIVE
Protein, UA: POSITIVE — AB
Spec Grav, UA: 1.025 (ref 1.010–1.025)
Urobilinogen, UA: 0.2 U/dL
pH, UA: 6.5 (ref 5.0–8.0)

## 2023-07-16 MED ORDER — NITROFURANTOIN MONOHYD MACRO 100 MG PO CAPS: 100.0000 mg | ORAL_CAPSULE | Freq: Two times a day (BID) | ORAL | 0 refills | Status: DC

## 2023-07-16 NOTE — Progress Notes (Signed)
Marikay Alar, MD Phone: [phone]  Allison Hudson is a 53 y.o. female who presents today for CPE.  Diet: Healthy, meal preps, eats most of her carbs in the morning Exercise: Yoga and gym work 4-5 times a week Pap smear: 04/12/20 Neg HPV, NILM Colonoscopy: UTD Mammogram: due Family history-  Colon cancer: no  Breast cancer: no  Ovarian cancer: no Menses: hysto Vaccines-   Flu: declines  Tetanus: declines  Shingles: declines  COVID19: declines HIV screening: UTD Hep C Screening: UTD Tobacco use: no Alcohol use: 1/week Illicit Drug use: no Dentist: yes Ophthalmology: yes Does report some dysuria and urinary frequency for the last couple of days. Patient is also here for preoperative exam as she is going to have her breast implants removed.  She notes no chest pain or shortness of breath when exercising.  The 10-year ASCVD risk score (Arnett DK, et al., 2019) is: 0.5%   Values used to calculate the score:     Age: 8 years     Sex: Female     Is Non-Hispanic African American: No     Diabetic: No     Tobacco smoker: No     Systolic Blood Pressure: 98 mmHg     Is BP treated: No     HDL Cholesterol: 79.3 mg/dL     Total Cholesterol: 199 mg/dL   Active Ambulatory Problems    Diagnosis Date Noted   Anxiety and depression 07/02/2016   Insomnia 07/02/2016   Encounter for monitoring testosterone replacement therapy 03/02/2017   Pelvic pain 08/30/2017   Fatigue 08/02/2018   Elevated BP without diagnosis of hypertension 08/02/2018   Cough 01/15/2020   Encounter for general adult medical examination with abnormal findings 04/12/2020   Dyspareunia in female 04/12/2020   Hair loss 05/26/2021   Labia irritation 04/13/2022   Urine frequency 04/27/2022   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/28/2022   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   Left otitis media 12/17/2022   Dizziness 04/06/2023   UTI (urinary tract infection) 06/08/2023   Preop examination  07/16/2023   Resolved Ambulatory Problems    Diagnosis Date Noted   Influenza-like illness 07/02/2016   Past Medical History:  Diagnosis Date   Depression    Frequent headaches     Family History  Problem Relation Age of Onset   Hypertension Mother    Hyperlipidemia Mother    Breast cancer Neg Hx     Social History   Socioeconomic History   Marital status: Single    Spouse name: Not on file   Number of children: Not on file   Years of education: Not on file   Highest education level: Not on file  Occupational History   Not on file  Tobacco Use   Smoking status: Never   Smokeless tobacco: Never  Vaping Use   Vaping status: Never Used  Substance and Sexual Activity   Alcohol use: Yes    Alcohol/week: 3.0 standard drinks of alcohol    Types: 3 Standard drinks or equivalent per week    Comment: occass   Drug use: No   Sexual activity: Yes    Partners: Male    Birth control/protection: Surgical    Comment: hyster  Other Topics Concern   Not on file  Social History Narrative   Not on file   Social Drivers of Health   Financial Resource Strain: Not on file  Food Insecurity: Not on file  Transportation Needs: Not on  file  Physical Activity: Not on file  Stress: Not on file  Social Connections: Not on file  Intimate Partner Violence: Not on file    ROS  General:  Negative for nexplained weight loss, fever Skin: Negative for new or changing mole, sore that won't heal HEENT: Negative for trouble hearing, trouble seeing, ringing in ears, mouth sores, hoarseness, change in voice, dysphagia. CV:  Negative for chest pain, dyspnea, edema, palpitations Resp: Negative for cough, dyspnea, hemoptysis GI: Negative for nausea, vomiting, diarrhea, constipation, abdominal pain, melena, hematochezia. GU: Negative for dysuria, incontinence, urinary hesitance, hematuria, vaginal or penile discharge, polyuria, sexual difficulty, lumps in testicle or breasts MSK: Negative for  muscle cramps or aches, joint pain or swelling Neuro: Negative for headaches, weakness, numbness, dizziness, passing out/fainting Psych: Negative for depression, anxiety, memory problems  Objective  Physical Exam Vitals:   07/16/23 1410  BP: 98/72  Pulse: (!) 57  Resp: 18  Temp: 98.2 F (36.8 C)  SpO2: 100%    BP Readings from Last 3 Encounters:  07/16/23 98/72  06/08/23 114/72  04/06/23 112/82   Wt Readings from Last 3 Encounters:  07/16/23 128 lb 6 oz (58.2 kg)  06/08/23 127 lb 6.4 oz (57.8 kg)  04/06/23 127 lb (57.6 kg)    Physical Exam Constitutional:      General: She is not in acute distress.    Appearance: She is not diaphoretic.  HENT:     Head: Normocephalic and atraumatic.  Cardiovascular:     Rate and Rhythm: Normal rate and regular rhythm.     Heart sounds: Normal heart sounds.  Pulmonary:     Effort: Pulmonary effort is normal.     Breath sounds: Normal breath sounds.  Abdominal:     General: Bowel sounds are normal. There is no distension.     Palpations: Abdomen is soft.     Tenderness: There is no abdominal tenderness.  Musculoskeletal:     Right lower leg: No edema.     Left lower leg: No edema.  Lymphadenopathy:     Cervical: No cervical adenopathy.  Skin:    General: Skin is warm and dry.  Neurological:     Mental Status: She is alert.  Psychiatric:        Mood and Affect: Mood normal.      Assessment/Plan:   Encounter for general adult medical examination with abnormal findings Assessment & Plan: Physical exam completed.  Encouraged continued healthy diet and exercise.  Discussed A1c has trended up some though remains in the normal range.  Discussed we need to keep an eye on this.  Cholesterol has trended down some.  Other lab work is acceptable.  She will call to schedule her mammogram.  She declines all vaccines today.  She understands the purpose of these vaccines and knows she can change her mind at any time on getting these.  Lab  work has been reviewed with patient.   Preop examination Assessment & Plan: Patient seen for preoperative exam today.  Patient is low risk from cardiovascular and medical perspective.  Chales Abrahams perioperative risk score of 0.0% for her breast surgery.  She is acceptable risk to proceed with surgery.  Orders: -     EKG 12-Lead  Urinary frequency -     POCT Urinalysis Dipstick (Automated) -     Urine Microscopic  Abnormal urinalysis -     Urine Culture  Urine frequency Assessment & Plan: Noted recently.  Concern for UTI based on urinalysis  and history.  Treat with Macrobid 100 mg twice daily for 7 days.  Sending urine for culture and microscopy.  Orders: -     Nitrofurantoin Monohyd Macro; Take 1 capsule (100 mg total) by mouth 2 (two) times daily.  Dispense: 14 capsule; Refill: 0    Return in about 1 year (around 07/15/2024) for physical/transfer of care.   Marikay Alar, MD [facility]

## 2023-07-16 NOTE — Assessment & Plan Note (Signed)
Patient seen for preoperative exam today.  Patient is low risk from cardiovascular and medical perspective.  Allison Hudson perioperative risk score of 0.0% for her breast surgery.  She is acceptable risk to proceed with surgery.

## 2023-07-16 NOTE — Assessment & Plan Note (Signed)
Physical exam completed.  Encouraged continued healthy diet and exercise.  Discussed A1c has trended up some though remains in the normal range.  Discussed we need to keep an eye on this.  Cholesterol has trended down some.  Other lab work is acceptable.  She will call to schedule her mammogram.  She declines all vaccines today.  She understands the purpose of these vaccines and knows she can change her mind at any time on getting these.  Lab work has been reviewed with patient.

## 2023-07-16 NOTE — Assessment & Plan Note (Signed)
Noted recently.  Concern for UTI based on urinalysis and history.  Treat with Macrobid 100 mg twice daily for 7 days.  Sending urine for culture and microscopy.

## 2023-07-18 LAB — URINE CULTURE
MICRO NUMBER:: 15994683
SPECIMEN QUALITY:: ADEQUATE

## 2023-07-18 LAB — URINALYSIS, MICROSCOPIC ONLY: WBC, UA: >=60 /[HPF] — AB (ref 0–5)

## 2023-07-19 ENCOUNTER — Other Ambulatory Visit: Payer: Self-pay | Admitting: Family Medicine

## 2023-07-19 DIAGNOSIS — N3001 Acute cystitis with hematuria: Secondary | ICD-10-CM

## 2023-08-20 ENCOUNTER — Other Ambulatory Visit: Payer: BC Managed Care – PPO

## 2023-12-02 ENCOUNTER — Telehealth: Payer: Self-pay

## 2023-12-02 NOTE — Telephone Encounter (Signed)
 Copied from CRM [id number]. Topic: General - Other >> Dec 02, 2023  1:24 PM Dorisann Garre T wrote: Reason for CRM: patient is wanting to know if she can come in and do a urine sample she is for sure she has a uti  she would like a call back regarding this

## 2023-12-03 NOTE — Telephone Encounter (Signed)
 Left message for patient to give our office a call back to inform her that per Dr Casimir Cleaver patient would need an appointment, as she has never seen the patient to treat her. Patient would need an appointment with any provider for further treatment or she can go to a local urgent care for treatment.  OK for E2C2 to relay message if patient calls back. If relayed, please notify the office.

## 2023-12-07 ENCOUNTER — Ambulatory Visit: Payer: BC Managed Care – PPO | Admitting: Dermatology

## 2023-12-07 DIAGNOSIS — L900 Lichen sclerosus et atrophicus: Secondary | ICD-10-CM | POA: Diagnosis not present

## 2023-12-07 NOTE — Telephone Encounter (Addendum)
   At first nurse Melanie, from skin care office, was calling on pt's behalf because upon their physical exam they noticed redness to pt's genital region and reporting the UTI s/s for pt, including: Urinary freq/pain, odor & D/C.  Pt told skin care office staff that she had already been trying to get in contact with PCP office since last week regarding her UTI s/s and no one had gotten back to her all weekend.  Nurse Prentice Brochure offered to put pt on phone because pt is still in the office.  Nurse asked if she could please speak to pt, for a moment to confirm.  Pt very upset and annoyed with this nurse, states she talked to someone on Thursday last week and still no help. Pt asking why was anything not done yet and why has no one got back to her, nurse apologizes for the miscommunication and improper handling of her care. Pt still agitated with nurse and very upset anyway, just wants to speak with director and been seen by PCP office.  Nurse explained she did not see availability at this moment but could try to see if more is available that may not be up, but only sees availability on Monday June 23 so far. Nurse tried to explain to pt that urgent care can also assess pt and take U/A  to provide proper meds for Tx. Pt refused UC multiple times but finally allowed nurse to look up a closest UC and nurse will be calling pt back on her cell, and confirmed pt's mobile # before ending call.  Upon calling pt back, pt still very annoyed and upset. Pt does not want to go to  walk into the clinic close to her home (1[place]35miles away) or make last available appmt they have for today.  Pt only wants Director of the office to call her back directly.   Copied from CRM [id number]. Topic: Clinical - Red Word Triage >> Dec 07, 2023  4:12 PM [place] J wrote: [facility] that prompted transfer to Nurse Triage: Front office of [employer] calling in on behalf of patient. Patient is having an increase of pain when urinating and  is also showing redness in her private area. She also stated that there's an issue with discharge and frequent urinating.

## 2023-12-07 NOTE — Progress Notes (Signed)
   Follow-Up Visit   Subjective  Allison Hudson is a 53 y.o. female who presents for the following: 6 month follow-up LSetA. Using Clobetasol  ointment once a week and doing well. Patient thinks she has a UTI currently, which she feels is worsening condition with increasing frequency, pain, smell with urination.     The following portions of the chart were reviewed this encounter and updated as appropriate: medications, allergies, medical history  Review of Systems:  No other skin or systemic complaints except as noted in HPI or Assessment and Plan.  Objective  Well appearing patient in no apparent distress; mood and affect are within normal limits.  A focused examination was performed of the following areas: Face, groin  Relevant physical exam findings are noted in the Assessment and Plan.    Assessment & Plan   LICHEN SCLEROSUS ET ATROPHICUS Exam:  Mild erythema at labia minora, inferior introitus; no open sores, no erosions, no purpura; mild erythema perianal.     Lichen sclerosus is a chronic inflammatory condition of unknown cause that frequently involves the vaginal area and less commonly extragenital skin, and is NOT sexually transmitted. It frequently causes symptoms of pain and burning.  It requires regular monitoring and treatment with topical steroids to minimize inflammation and to reduce risk of scarring. There is also a risk of cancer in the vaginal area which is very low if inflammation is well controlled. Regular checks of the area are recommended. Please call if you notice any new or changing spots within this area.      Treatment Plan: Continue Clobetasol  ointment 1-2x weekly. Can increase to 3-5 days per week prn increasing symptoms   Continue Analpram twice daily as needed for itching perianal area    Recommend using Zinc Oxide topical or other skin protectant like vaseline ointment to affected areas as needed for irritation.  Apply Vaseline Jelly or Aquaphor  each time after urinating.    Topical steroids (such as triamcinolone, fluocinolone, fluocinonide, mometasone, clobetasol , halobetasol, betamethasone, hydrocortisone ) can cause thinning and lightening of the skin if they are used for too long in the same area. Your physician has selected the right strength medicine for your problem and area affected on the body. Please use your medication only as directed by your physician to prevent side effects.   Referral to PCP for evaluation of UTI symptoms      Return in about 6 months (around 06/07/2024).  IBernardine Bridegroom, CMA, am acting as scribe for Artemio Larry, MD .   Documentation: I have reviewed the above documentation for accuracy and completeness, and I agree with the above.  Artemio Larry, MD

## 2023-12-07 NOTE — Patient Instructions (Signed)

## 2023-12-08 NOTE — Telephone Encounter (Signed)
 Called patient and after identifying the right person, I identified myself and advised patient how could I help her. Patient proceeded with needed an appointment for uti symptoms and that patient could not get one here and patient stated when I need an appointment for uti I know what I have and I don't see why you cannot just check my urine and treat me I advised we cannot due to made need culture or what if not a UTI could need further care. I advised patient urgent care was offered and patient stated no I am not going to go sit and wait to be seen when I should be able to get an appointment.I can transfer MY care else where. I apologized to patient and advised the shortage of Primary Care providers and advised patient appointment can be made for UC at [facility] patient stated no one advised  an appointment could be made.While on phone with patient I attained the [facility] appt scheduled and advised one appointment left at 6:15 PM today. Patient ask do not cancel appointment for Monday that patient would like to keep.

## 2023-12-08 NOTE — Telephone Encounter (Signed)
 Patient requesting a call from practice admin.

## 2023-12-13 ENCOUNTER — Ambulatory Visit (INDEPENDENT_AMBULATORY_CARE_PROVIDER_SITE_OTHER): Admitting: Internal Medicine

## 2023-12-13 ENCOUNTER — Encounter: Payer: Self-pay | Admitting: Internal Medicine

## 2023-12-13 VITALS — BP 118/78 | HR 85 | Temp 97.9°F | Ht 61.5 in | Wt 123.6 lb

## 2023-12-13 DIAGNOSIS — N3001 Acute cystitis with hematuria: Secondary | ICD-10-CM

## 2023-12-13 DIAGNOSIS — R350 Frequency of micturition: Secondary | ICD-10-CM

## 2023-12-13 LAB — POC URINALSYSI DIPSTICK (AUTOMATED)
Bilirubin, UA: NEGATIVE
Blood, UA: NEGATIVE
Glucose, UA: NEGATIVE
Ketones, UA: NEGATIVE
Leukocytes, UA: NEGATIVE
Nitrite, UA: NEGATIVE
Protein, UA: NEGATIVE
Spec Grav, UA: 1.01 (ref 1.010–1.025)
Urobilinogen, UA: 0.2 U/dL
pH, UA: 6 (ref 5.0–8.0)

## 2023-12-13 MED ORDER — NITROFURANTOIN MONOHYD MACRO 100 MG PO CAPS: 100.0000 mg | ORAL_CAPSULE | Freq: Two times a day (BID) | ORAL | 0 refills | Status: DC

## 2023-12-13 NOTE — Patient Instructions (Signed)
-   It was a pleasure meeting you today -Your symptoms are consistent with urinary tract infection and the urine test done here was negative as you have already completed 3 days of antibiotics at home -We will send the urine for urine culture but I suspect this will be negative as you have already completed 3-day course of antibiotics -I will prescribe a course of nitrofurantoin  for you for urinary tract infection -Please continue with your clobetasol  ointment for the lichen sclerosus -Please contact us  with any questions or concerns

## 2023-12-13 NOTE — Addendum Note (Signed)
 Addended by: SEBASTIAN NORRIS A on: 12/13/2023 09:15 AM   Modules accepted: Orders

## 2023-12-13 NOTE — Assessment & Plan Note (Signed)
-   Patient complains of increased frequency, urgency as well as dysuria beginning last Wednesday -She did take 3 days of nitrofurantoin  at home (had some from a prior urinary tract infection) with improvement in her symptoms -Patient's UA was negative likely secondary to taking antibiotics at home.  Will follow-up urine culture but I suspect this will be negative as well -Patient did have a prior UTI in January which grew E. coli sensitive to nitrofurantoin  -Will prescribe nitrofurantoin  10 tablets to complete a 5-day course -No further workup at this time

## 2023-12-13 NOTE — Progress Notes (Signed)
 Acute Office Visit  Subjective:     Patient ID: Allison Hudson, female    DOB: 07-15-1970, 53 y.o.   MRN: [id number]  Chief Complaint  Patient presents with   Acute Visit    Possible UTI     HPI Patient is in today for UTI symptoms.  Patient states that she has had dysuria, increased frequency and urgency since last Wednesday.  She states that she has been trying to get an appointment to see it but was unable to get 1 hour prior to today.  Patient did have some antibiotics (Macrobid ) at home from her prior urinary tract infection and had completed 3 doses of this with improvement in her symptoms.  Patient does have a history of lichen sclerosis of the vulva and states that she uses clobetasol  ointment for this and that the urinary tract infection has made these symptoms worse.  She has had to increase the usage of the clobetasol  ointment to help with this  Review of Systems  Constitutional: Negative.   HENT: Negative.    Respiratory: Negative.    Cardiovascular: Negative.   Genitourinary:  Positive for dysuria, frequency and urgency.  Musculoskeletal: Negative.   Neurological: Negative.   Psychiatric/Behavioral: Negative.          Objective:    BP 118/78   Pulse 85   Temp 97.9 F (36.6 C)   Ht 5' 1.5 (1.562 m)   Wt 123 lb 9.6 oz (56.1 kg)   SpO2 95%   BMI 22.98 kg/m    Physical Exam Constitutional:      Appearance: Normal appearance.  HENT:     Head: Normocephalic and atraumatic.   Cardiovascular:     Rate and Rhythm: Normal rate and regular rhythm.     Heart sounds: Normal heart sounds.  Pulmonary:     Breath sounds: Normal breath sounds. No wheezing or rales.  Abdominal:     General: There is no distension.     Palpations: Abdomen is soft.     Tenderness: There is no abdominal tenderness. There is no right CVA tenderness, left CVA tenderness, guarding or rebound.   Neurological:     Mental Status: She is alert. Mental status is at baseline.    Psychiatric:        Mood and Affect: Mood normal.        Behavior: Behavior normal.     Results for orders placed or performed in visit on 12/13/23  POCT Urinalysis Dipstick (Automated)  Result Value Ref Range   Color, UA yellow    Clarity, UA clear    Glucose, UA Negative Negative   Bilirubin, UA neg    Ketones, UA neg    Spec Grav, UA 1.010 1.010 - 1.025   Blood, UA neg    pH, UA 6.0 5.0 - 8.0   Protein, UA Negative Negative   Urobilinogen, UA 0.2 0.2 or 1.0 E.U./dL   Nitrite, UA neg    Leukocytes, UA Negative Negative        Assessment & Plan:   Problem List Items Addressed This Visit       Genitourinary   UTI (urinary tract infection)   - Patient complains of increased frequency, urgency as well as dysuria beginning last Wednesday -She did take 3 days of nitrofurantoin  at home (had some from a prior urinary tract infection) with improvement in her symptoms -Patient's UA was negative likely secondary to taking antibiotics at home.  Will follow-up urine culture but  I suspect this will be negative as well -Patient did have a prior UTI in January which grew E. coli sensitive to nitrofurantoin  -Will prescribe nitrofurantoin  10 tablets to complete a 5-day course -No further workup at this time      Relevant Medications   nitrofurantoin , macrocrystal-monohydrate, (MACROBID ) 100 MG capsule   Other Visit Diagnoses       Urinary frequency    -  Primary   Relevant Orders   POCT Urinalysis Dipstick (Automated) (Completed)       Meds ordered this encounter  Medications   nitrofurantoin , macrocrystal-monohydrate, (MACROBID ) 100 MG capsule    Sig: Take 1 capsule (100 mg total) by mouth 2 (two) times daily.    Dispense:  10 capsule    Refill:  0    No follow-ups on file.  Tamsen Reist, MD

## 2023-12-15 ENCOUNTER — Ambulatory Visit: Payer: Self-pay | Admitting: Internal Medicine

## 2023-12-15 LAB — URINE CULTURE
MICRO NUMBER:: 16612655
Result:: NO GROWTH
SPECIMEN QUALITY:: ADEQUATE

## 2023-12-16 NOTE — Telephone Encounter (Signed)
 Copied from CRM [id number]. Topic: Clinical - Lab/Test Results >> Dec 16, 2023  8:54 AM Viola F wrote: Reason for CRM: Patient returned [facility] phone call from yesterday, I relayed the results to the patient and she had no further questions.

## 2024-03-06 ENCOUNTER — Ambulatory Visit: Payer: Self-pay

## 2024-03-06 ENCOUNTER — Ambulatory Visit: Admission: RE | Admit: 2024-03-06 | Discharge: 2024-03-06 | Disposition: A | Discharge status: Home / Self Care

## 2024-03-06 VITALS — BP 118/83 | HR 58 | Temp 97.8°F | Resp 19

## 2024-03-06 DIAGNOSIS — J0100 Acute maxillary sinusitis, unspecified: Secondary | ICD-10-CM

## 2024-03-06 MED ORDER — AMOXICILLIN-POT CLAVULANATE 875-125 MG PO TABS: 1.0000 | ORAL_TABLET | Freq: Two times a day (BID) | ORAL | 0 refills | Status: DC

## 2024-03-06 NOTE — Telephone Encounter (Signed)
 First attempt; no answer;  Pt says she has a head cold and he ears feel full. She has tried otc medications with no releief. She says she is very congested, throat hurts sometimes. She says she can hear wooshing sounds when she moves. Sometimes she feels like she would become sob when she moves around.

## 2024-03-06 NOTE — Discharge Instructions (Addendum)
 Take the Augmentin as directed.  Follow up with your primary care provider if your symptoms are not improving.

## 2024-03-06 NOTE — ED Triage Notes (Signed)
 Patient to Urgent Care with complaints of cough/ ear pain/ sore throat/ fluid in her bilateral ears/ headaches/ SHOB.  Symptoms x2 weeks.   Taking otc allergy medication/ prescription nasal spray/ nettipot/ humidifier/ nyquil and dayquil

## 2024-03-06 NOTE — ED Provider Notes (Signed)
 Allison Hudson    CSN: [id number] Arrival date & time: 03/06/24  1507      History   Chief Complaint Chief Complaint  Patient presents with   Cough    Ears hurting(fluid), sore throat, cough, diarrhea - Entered by patient    HPI Allison Hudson is a 53 y.o. female.  Patient presents with 2-week history of ear pain, sore throat, congestion, cough, shortness of breath, diarrhea, headache.  No fever, chest pain, vomiting.  She has been treating her symptoms with DayQuil and NyQuil.  The history is provided by the patient and medical records.    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   Left otitis media 12/17/2022   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/28/2022   Urine frequency 04/27/2022   Labia irritation 04/13/2022   Hair loss 05/26/2021   Encounter for general adult medical examination with abnormal findings 04/12/2020   Dyspareunia in female 04/12/2020   Cough 01/15/2020   Fatigue 08/02/2018   Elevated BP without diagnosis of hypertension 08/02/2018   Pelvic pain 08/30/2017   Encounter for monitoring testosterone  replacement therapy 03/02/2017   Anxiety and depression 07/02/2016   Insomnia 07/02/2016    Past Surgical History:  Procedure Laterality Date   ABDOMINAL HYSTERECTOMY     AUGMENTATION MAMMAPLASTY Bilateral 2014   CAPSULECTOMY     COLONOSCOPY WITH PROPOFOL  N/A 05/06/2020   Procedure: COLONOSCOPY WITH PROPOFOL ;  Surgeon: Therisa Bi, MD;  Location: [facility] ENDOSCOPY;  Service: Gastroenterology;  Laterality: N/A;   MASTECTOMY      OB History     Gravida  5   Para  4   Term  3   Preterm  1   AB  1   Living  4      SAB  1   IAB      Ectopic      Multiple      Live Births  4             Home Medications    Prior to Admission medications   Medication Sig Start Date End Date Taking? Authorizing Provider  amoxicillin -clavulanate (AUGMENTIN ) 875-125 MG tablet Take 1 tablet by mouth every 12 (twelve) hours. 03/06/24  Yes Corlis Burnard DEL, NP  fluticasone (FLONASE) 50 MCG/ACT nasal spray Place into both nostrils daily.   Yes [provider]  hydrocortisone -pramoxine [facility]) 2.5-1 % rectal cream Apply to rash around rectal area BID PRN itching 06/03/22   Stewart, Tara, MD  nitrofurantoin , macrocrystal-monohydrate, (MACROBID ) 100 MG capsule Take 1 capsule (100 mg total) by mouth 2 (two) times daily. Patient not taking: Reported on 03/06/2024 12/13/23   Narendra, Nischal, MD    Family History Family History  Problem Relation Age of Onset   Hypertension Mother    Hyperlipidemia Mother    Breast cancer Neg Hx     Social History Social History   Tobacco Use   Smoking status: Never   Smokeless tobacco: Never  Vaping Use   Vaping status: Never Used  Substance Use Topics   Alcohol use: Yes    Alcohol/week: 3.0 standard drinks of alcohol    Types: 3 Standard drinks or equivalent per week    Comment: occass   Drug  use: No     Allergies   Patient has no known allergies.   Review of Systems Review of Systems  Constitutional:  Negative for chills and fever.  HENT:  Positive for congestion, ear pain and sore throat.   Respiratory:  Positive for cough and shortness of breath.   Gastrointestinal:  Positive for diarrhea. Negative for vomiting.  Neurological:  Positive for headaches.     Physical Exam Triage Vital Signs ED Triage Vitals  Encounter Vitals Group     BP      Girls Systolic BP Percentile      Girls Diastolic BP Percentile      Boys Systolic BP Percentile      Boys Diastolic BP Percentile      Pulse      Resp      Temp      Temp src      SpO2      Weight      Height      Head Circumference      Peak Flow      Pain Score       Pain Loc      Pain Education      Exclude from Growth Chart    No data found.  Updated Vital Signs BP 118/83   Pulse (!) 58   Temp 97.8 F (36.6 C)   Resp 19   SpO2 98%   Visual Acuity Right Eye Distance:   Left Eye Distance:   Bilateral Distance:    Right Eye Near:   Left Eye Near:    Bilateral Near:     Physical Exam Constitutional:      General: She is not in acute distress. HENT:     Right Ear: Tympanic membrane normal.     Left Ear: Tympanic membrane normal.     Nose: Congestion present.     Mouth/Throat:     Mouth: Mucous membranes are moist.     Pharynx: Oropharynx is clear.  Cardiovascular:     Rate and Rhythm: Normal rate and regular rhythm.     Heart sounds: Normal heart sounds.  Pulmonary:     Effort: Pulmonary effort is normal. No respiratory distress.     Breath sounds: Normal breath sounds.  Abdominal:     Palpations: Abdomen is soft.     Tenderness: There is no abdominal tenderness. There is no guarding or rebound.  Neurological:     Mental Status: She is alert.      UC Treatments / Results  Labs (all labs ordered are listed, but only abnormal results are displayed) Labs Reviewed - No data to display  EKG   Radiology No results found.  Procedures Procedures (including critical care time)  Medications Ordered in UC Medications - No data to display  Initial Impression / Assessment and Plan / UC Course  I have reviewed the triage vital signs and the nursing notes.  Pertinent labs & imaging results that were available during my care of the patient were reviewed by me and considered in my medical decision making (see chart for details).    Acute sinusitis.  Afebrile and vital signs are stable.  Lungs are clear and O2 sat is 98% on room air.  Patient has been symptomatic for 2 weeks and is not improving with OTC treatment.  Treating today with Augmentin .  Education provided on sinus infection.  Instructed her to follow-up with her PCP if  she is not improving.  She  agrees to plan of care.  Final Clinical Impressions(s) / UC Diagnoses   Final diagnoses:  Acute non-recurrent maxillary sinusitis     Discharge Instructions      Take the Augmentin  as directed.  Follow-up with your primary care provider if your symptoms are not improving.      ED Prescriptions     Medication Sig Dispense Auth. Provider   amoxicillin -clavulanate (AUGMENTIN ) 875-125 MG tablet Take 1 tablet by mouth every 12 (twelve) hours. 14 tablet Corlis Burnard DEL, NP      PDMP not reviewed this encounter.   Corlis Burnard DEL, NP 03/06/24 [phone]

## 2024-03-06 NOTE — Telephone Encounter (Signed)
 Noted

## 2024-03-06 NOTE — Telephone Encounter (Signed)
 FYI Only or Action Required?: FYI only for provider.  Patient was last seen in primary care on 12/13/2023 by Allison Claude, MD.  Called Nurse Triage reporting Sinusitis.  Symptoms began a week ago.  Interventions attempted: OTC medications: fluticasone nasal spray, cetirizine, Dayquil, nettipot, humidifier.  Symptoms are:SOB with exertion, cough, intermittent sore throat, ear aches and congestion, nasal sinus discomfort, clear nasal drainage gradually worsening.  Triage Disposition: See Physician Within 24 Hours  Patient/caregiver understands and will follow disposition?: Yes- she already has an appt with urgent care and states she was just calling to see if her PCP office has appts, declined appts with other offices available today           Reason for Disposition  Earache  Answer Assessment - Initial Assessment Questions 1. LOCATION: Where does it hurt?      Nose.  2. ONSET: When did the sinus pain start?  (e.g., hours, days)      X 1 week.  3. SEVERITY: How bad is the pain?   (Scale 0-10; or none, mild, moderate or severe)     She states it doesn't hurt but it is irritating.  4. RECURRENT SYMPTOM: Have you ever had sinus problems before? If Yes, ask: When was the last time? and What happened that time?      Yes, last time was around May. She states this feels like when she got a head cold and turned into a double ear infection from fluid building up behind her ears. Treated with antibiotic. She states then she was back in around June and had same thing happen again, treated with antibiotics again.  5. NASAL CONGESTION: Is the nose blocked? If Yes, ask: Can you open it or must you breathe through your mouth?     She states she can breathe through her nose.  6. NASAL DISCHARGE: Do you have discharge from your nose? If so ask, What color?     Yes, clear.  7. FEVER: Do you have a fever? If Yes, ask: What is it, how was it measured, and when did  it start?      No.  8. OTHER SYMPTOMS: Do you have any other symptoms? (e.g., sore throat, cough, earache, difficulty breathing)     She states she feels SOB if she gets up and exerts herself; ear congestion/fluid build up; scratchy cough; intermittent sore throat; fatigue. Denies body aches, SOB at rest. She states her daughter was diagnosed with COVID a week ago.  9. PREGNANCY: Is there any chance you are pregnant? When was your last menstrual period?     N/A.  Protocols used: Sinus Pain or Congestion-A-AH

## 2024-04-01 DIAGNOSIS — H6692 Otitis media, unspecified, left ear: Secondary | ICD-10-CM | POA: Diagnosis not present

## 2024-04-01 DIAGNOSIS — J029 Acute pharyngitis, unspecified: Secondary | ICD-10-CM | POA: Diagnosis not present

## 2024-04-01 DIAGNOSIS — R059 Cough, unspecified: Secondary | ICD-10-CM | POA: Diagnosis not present

## 2024-04-01 DIAGNOSIS — Z20822 Contact with and (suspected) exposure to covid-19: Secondary | ICD-10-CM | POA: Diagnosis not present

## 2024-04-02 ENCOUNTER — Ambulatory Visit

## 2024-05-10 NOTE — Telephone Encounter (Signed)
 open in error

## 2024-05-15 ENCOUNTER — Other Ambulatory Visit: Payer: Self-pay | Admitting: Dermatology

## 2024-05-15 DIAGNOSIS — L900 Lichen sclerosus et atrophicus: Secondary | ICD-10-CM

## 2024-05-17 ENCOUNTER — Telehealth: Payer: Self-pay

## 2024-05-17 NOTE — Telephone Encounter (Signed)
 Hydrocort -Pramoxine PA required through RX Benefits.  Phone: [phone] BIN: [id number] PCN: [id number] GRP: [id number] ID: [id number]  Download Request  Print Thank you for creating a prior authorization request using PromptPA. The prior authorization department will contact you if additional information is needed. Once a decision is made you will be notified of the decision. You can check the status of your request using the Check Status link. Please note down (Prior Auth EOC ID) to check status. Prior Authorization request details: Prior Auth (EOC) ID: [id number] Drug/Service Name: HYDROCORT -PRAMOXINE 2.5-1% CRM Patient: Allison Hudson Date Requested: 05/17/2024 2:22:20 PM   MemberID: [id number] DOB: Mar 27, 1971

## 2024-05-22 ENCOUNTER — Other Ambulatory Visit: Payer: Self-pay

## 2024-05-22 DIAGNOSIS — L900 Lichen sclerosus et atrophicus: Secondary | ICD-10-CM

## 2024-05-22 MED ORDER — HYDROCORT-PRAMOXINE (PERIANAL) 2.5-1 % EX CREA: TOPICAL_CREAM | CUTANEOUS | 4 refills | Status: AC

## 2024-05-22 NOTE — Progress Notes (Signed)
 Insurance denied RX PA.   Sent patient GoodRX coupon and patient is going to use at [facility] for $40. aw

## 2024-05-30 ENCOUNTER — Ambulatory Visit: Admitting: Dermatology

## 2024-05-30 DIAGNOSIS — L900 Lichen sclerosus et atrophicus: Secondary | ICD-10-CM | POA: Diagnosis not present

## 2024-05-30 MED ORDER — HYDROCORTISONE ACE-PRAMOXINE 1-1 % EX CREA: TOPICAL_CREAM | CUTANEOUS | 5 refills | Status: AC

## 2024-05-30 NOTE — Patient Instructions (Signed)

## 2024-05-30 NOTE — Progress Notes (Signed)
   Follow-Up Visit   Subjective  Allison Hudson is a 53 y.o. female who presents for the following: Lichen Sclerosis et Atrophicus, 6 month follow-up. Patient is doing well with treatment. She uses Analpram  about 3 times a week and clobetasol  ointment 1-2 times a week. Analpram  was not covered the last Rx patient tried to pick up    The following portions of the chart were reviewed this encounter and updated as appropriate: medications, allergies, medical history  Review of Systems:  No other skin or systemic complaints except as noted in HPI or Assessment and Plan.  Objective  Well appearing patient in no apparent distress; mood and affect are within normal limits.  A focused examination was performed of the following areas:   Relevant physical exam findings are noted in the Assessment and Plan.    Assessment & Plan  LICHEN SCLEROSUS ET ATROPHICUS  Exam: Mild erythema labia majora, no erosions, no ulcerations  Chronic condition with duration or expected duration over one year. Currently well-controlled.   Lichen sclerosus is a chronic inflammatory condition of unknown cause that frequently involves the vaginal area and less commonly extragenital skin, and is NOT sexually transmitted. It frequently causes symptoms of pain and burning. It requires regular monitoring and treatment with topical steroids to minimize inflammation and to reduce risk of scarring. There is also a risk of cancer in the vaginal area which is very low if inflammation is well controlled. Regular checks of the area are recommended. Please call if you notice any new or changing spots within this area.   Treatment Plan: Continue Clobetasol  ointment 1-2x weekly to vaginal area, avoid rectal area. Can increase to 3-5 days per week prn increasing symptoms.  Continue pramoxine-hydrocortisone  1-1% (decreased strength sent in to see if covered) apply to rash around rectal area twice a day as needed for itching.  If not  effective, pt may call for rf of 2.5- 1% version.  Caution skin atrophy with long-term use.     Return in about 1 year (around 05/30/2025) for LSetA.  IAndrea Kerns, CMA, am acting as scribe for Rexene Rattler, MD .   Documentation: I have reviewed the above documentation for accuracy and completeness, and I agree with the above.  Rexene Rattler, MD

## 2024-07-04 ENCOUNTER — Other Ambulatory Visit: Payer: Self-pay

## 2024-07-04 MED ORDER — ALCLOMETASONE DIPROPIONATE 0.05 % EX CREA: TOPICAL_CREAM | CUTANEOUS | 3 refills | Status: AC

## 2024-07-04 MED ORDER — ALCLOMETASONE DIPROPIONATE 0.05 % EX CREA: TOPICAL_CREAM | CUTANEOUS | 3 refills | Status: DC

## 2024-07-04 NOTE — Progress Notes (Signed)
 Per letter from insurance hydrocortisone -pramoxine (ANALPRAM -HC) 2.5-1 % rectal cream is no longer covered. alclomethasone (ACLOVATE ) 0.05 % cream is on the list of covered alternatives; per Dr. Jackquline new RX sent to [facility] requested by patient. Patient is informed of RX change.

## 2024-07-20 ENCOUNTER — Ambulatory Visit: Payer: BC Managed Care – PPO | Admitting: Nurse Practitioner

## 2024-07-20 ENCOUNTER — Encounter: Payer: Self-pay | Admitting: Nurse Practitioner

## 2024-07-20 VITALS — BP 100/60 | HR 72 | Temp 97.7°F | Ht 61.5 in | Wt 128.0 lb

## 2024-07-20 DIAGNOSIS — N904 Leukoplakia of vulva: Secondary | ICD-10-CM

## 2024-07-20 DIAGNOSIS — Z1231 Encounter for screening mammogram for malignant neoplasm of breast: Secondary | ICD-10-CM

## 2024-07-20 DIAGNOSIS — Z0001 Encounter for general adult medical examination with abnormal findings: Secondary | ICD-10-CM

## 2024-07-20 DIAGNOSIS — M545 Low back pain, unspecified: Secondary | ICD-10-CM | POA: Insufficient documentation

## 2024-07-20 DIAGNOSIS — Z1329 Encounter for screening for other suspected endocrine disorder: Secondary | ICD-10-CM

## 2024-07-20 DIAGNOSIS — K625 Hemorrhage of anus and rectum: Secondary | ICD-10-CM | POA: Insufficient documentation

## 2024-07-20 DIAGNOSIS — E559 Vitamin D deficiency, unspecified: Secondary | ICD-10-CM

## 2024-07-20 DIAGNOSIS — E7800 Pure hypercholesterolemia, unspecified: Secondary | ICD-10-CM | POA: Insufficient documentation

## 2024-07-20 LAB — URINALYSIS, ROUTINE W REFLEX MICROSCOPIC
Bilirubin Urine: NEGATIVE
Hgb urine dipstick: NEGATIVE
Ketones, ur: NEGATIVE
Leukocytes,Ua: NEGATIVE
Nitrite: NEGATIVE
Specific Gravity, Urine: 1.01 (ref 1.000–1.030)
Total Protein, Urine: NEGATIVE
Urine Glucose: NEGATIVE
Urobilinogen, UA: 0.2 (ref 0.0–1.0)
pH: 6 (ref 5.0–8.0)

## 2024-07-20 LAB — POC URINALSYSI DIPSTICK (AUTOMATED)
Bilirubin, UA: NEGATIVE
Blood, UA: NEGATIVE
Glucose, UA: NEGATIVE
Ketones, UA: NEGATIVE
Leukocytes, UA: NEGATIVE
Nitrite, UA: NEGATIVE
Protein, UA: NEGATIVE
Spec Grav, UA: 1.015
Urobilinogen, UA: 0.2 U/dL
pH, UA: 6

## 2024-07-20 NOTE — Progress Notes (Unsigned)
 " Leron Glance, NP-C Phone: [phone]     Tobacco Use History[1]  Medications Ordered Prior to Encounter[2]   ROS see history of present illness  Objective  Physical Exam Vitals:   07/20/24 0831  BP: 100/60  Pulse: 72  Temp: 97.7 F (36.5 C)  SpO2: 99%    BP Readings from Last 3 Encounters:  07/20/24 100/60  03/06/24 118/83  12/13/23 118/78   Wt Readings from Last 3 Encounters:  07/20/24 128 lb (58.1 kg)  12/13/23 123 lb 9.6 oz (56.1 kg)  07/16/23 128 lb 6 oz (58.2 kg)    Physical Exam Constitutional:      General: She is not in acute distress.    Appearance: Normal appearance.  HENT:     Head: Normocephalic.     Right Ear: Tympanic membrane normal.     Left Ear: Tympanic membrane normal.     Nose: Nose normal.     Mouth/Throat:     Mouth: Mucous membranes are moist.     Pharynx: Oropharynx is clear.  Eyes:     Conjunctiva/sclera: Conjunctivae normal.     Pupils: Pupils are equal, round, and reactive to light.  Neck:     Thyroid : No thyromegaly.  Cardiovascular:     Rate and Rhythm: Normal rate and regular rhythm.     Heart sounds: Normal heart sounds.  Pulmonary:     Effort: Pulmonary effort is normal.     Breath sounds: Normal breath sounds.  Abdominal:     General: Abdomen is flat. Bowel sounds are normal.     Palpations: Abdomen is soft. There is no mass.     Tenderness: There is no abdominal tenderness.  Musculoskeletal:        General: Normal range of motion.  Lymphadenopathy:     Cervical: No cervical adenopathy.  Skin:    General: Skin is warm and dry.     Findings: No rash.  Neurological:     General: No focal deficit present.     Mental Status: She is alert.  Psychiatric:        Mood and Affect: Mood normal.        Behavior: Behavior normal.      Assessment/Plan: Please see individual problem list.  Encounter for general adult medical examination with abnormal findings  Acute bilateral low back pain without sciatica -      Comprehensive metabolic panel with GFR; Future -     POCT Urinalysis Dipstick (Automated) -     Urinalysis, Routine w reflex microscopic -     Urine Culture  Lichen sclerosus et atrophicus of the vulva  Rectal bleeding -     CBC with Differential/Platelet; Future  Elevated LDL cholesterol level -     Lipid panel; Future  Vitamin D  deficiency -     VITAMIN D  25 Hydroxy (Vit-D Deficiency, Fractures); Future  Thyroid  disorder screen -     TSH; Future  Screening mammogram for breast cancer -     3D Screening Mammogram, Left and Right; Future     Return for fasting labs then in 1 year for Annual Exam, sooner as needed.   Leron Glance, NP-C [facility] [place]    [1]  Social History Tobacco Use  Smoking Status Never  Smokeless Tobacco Never  [2]  Current Outpatient Medications on File Prior to Visit  Medication Sig Dispense Refill   alclomethasone (ACLOVATE ) 0.05 % cream APPLY TO RASH AROUND RECTAL AREA TWICE A DAY AS  NEEDED ITCHING 45 g 3   fluticasone (FLONASE) 50 MCG/ACT nasal spray Place into both nostrils daily.     hydrocortisone -pramoxine (ANALPRAM -HC) 2.5-1 % rectal cream APPLY TO RASH AROUND RECTAL AREA TWICE A DAY AS NEEDED ITCHING 30 g 4   pramoxine-hydrocortisone  (PROCTOCREAM-HC) 1-1 % rectal cream APPLY TO RASH AROUND RECTAL AREA TWICE A DAY AS NEEDED ITCHING 30 g 5   No current facility-administered medications on file prior to visit.   "

## 2024-07-21 LAB — URINE CULTURE
MICRO NUMBER:: 17526694
Result:: NO GROWTH
SPECIMEN QUALITY:: ADEQUATE

## 2024-07-25 ENCOUNTER — Ambulatory Visit: Payer: Self-pay | Admitting: Nurse Practitioner

## 2024-08-03 ENCOUNTER — Encounter

## 2024-08-10 ENCOUNTER — Other Ambulatory Visit

## 2024-08-14 ENCOUNTER — Encounter

## 2024-09-06 ENCOUNTER — Ambulatory Visit: Admitting: Obstetrics

## 2025-06-05 ENCOUNTER — Ambulatory Visit: Admitting: Dermatology

## 2025-07-24 ENCOUNTER — Encounter: Admitting: Nurse Practitioner
